# Patient Record
Sex: Male | Born: 1985 | Race: Black or African American | Hispanic: No | Marital: Single | Smoking: Current every day smoker
Health system: Southern US, Community
[De-identification: ages and names within clinical notes are randomized; demographics above are authoritative.]

---

## 2020-11-17 ENCOUNTER — Encounter (HOSPITAL_COMMUNITY): Payer: Self-pay | Admitting: Emergency Medicine

## 2020-11-17 ENCOUNTER — Emergency Department (HOSPITAL_COMMUNITY): Payer: Worker's Compensation

## 2020-11-17 ENCOUNTER — Emergency Department (HOSPITAL_COMMUNITY)
Admission: EM | Admit: 2020-11-17 | Discharge: 2020-11-17 | Disposition: A | Discharge status: Home / Self Care | Payer: Worker's Compensation | Attending: Emergency Medicine | Admitting: Emergency Medicine

## 2020-11-17 ENCOUNTER — Other Ambulatory Visit: Payer: Self-pay

## 2020-11-17 DIAGNOSIS — X501XXA Overexertion from prolonged static or awkward postures, initial encounter: Secondary | ICD-10-CM | POA: Diagnosis not present

## 2020-11-17 DIAGNOSIS — F17210 Nicotine dependence, cigarettes, uncomplicated: Secondary | ICD-10-CM | POA: Insufficient documentation

## 2020-11-17 DIAGNOSIS — S86001A Unspecified injury of right Achilles tendon, initial encounter: Secondary | ICD-10-CM | POA: Diagnosis not present

## 2020-11-17 DIAGNOSIS — S99911A Unspecified injury of right ankle, initial encounter: Secondary | ICD-10-CM | POA: Diagnosis present

## 2020-11-17 MED ORDER — HYDROCODONE-ACETAMINOPHEN 5-325 MG PO TABS: 1.0000 | ORAL_TABLET | Freq: Four times a day (QID) | ORAL | 0 refills | Status: DC | PRN

## 2020-11-17 MED ORDER — NAPROXEN 500 MG PO TABS: 500.0000 mg | ORAL_TABLET | Freq: Two times a day (BID) | ORAL | 0 refills | Status: DC

## 2020-11-17 MED ORDER — OXYCODONE-ACETAMINOPHEN 5-325 MG PO TABS
1.0000 | ORAL_TABLET | Freq: Once | ORAL | Status: AC
Start: 2020-11-17 — End: 2020-11-17
  Administered 2020-11-17: 17:00:00 1 via ORAL
  Filled 2020-11-17: qty 1

## 2020-11-17 NOTE — ED Provider Notes (Signed)
[facility] EMERGENCY DEPARTMENT Provider Note   CSN: [id number] Arrival date & time: 11/17/20  1507     History Chief Complaint  Patient presents with  . Ankle Pain    Craig Bowers is a 34 y.o. male.  Patient states that he was running and started having pain in his right ankle  The history is provided by the patient and medical records. No language interpreter was used.  Ankle Pain Location:  Ankle Injury: yes   Ankle location:  R ankle Pain details:    Quality:  Aching   Radiates to:  Does not radiate   Severity:  Moderate   Onset quality:  Sudden Associated symptoms: no back pain and no fatigue        No past medical history on file.  There are no problems to display for this patient.        No family history on file.  Social History   Tobacco Use  . Smoking status: Current Every Day Smoker    Packs/day: 0.50    Types: Cigarettes    Home Medications Prior to Admission medications   Not on File    Allergies    Patient has no allergy information on record.  Review of Systems   Review of Systems  Constitutional: Negative for appetite change and fatigue.  HENT: Negative for congestion, ear discharge and sinus pressure.   Eyes: Negative for discharge.  Respiratory: Negative for cough.   Cardiovascular: Negative for chest pain.  Gastrointestinal: Negative for abdominal pain and diarrhea.  Genitourinary: Negative for frequency and hematuria.  Musculoskeletal: Negative for back pain.       Right ankle pain  Skin: Negative for rash.  Neurological: Negative for seizures and headaches.  Psychiatric/Behavioral: Negative for hallucinations.    Physical Exam Updated Vital Signs BP (!) 154/104 (BP Location: Right Arm)   Pulse 94   Temp 98.5 F (36.9 C) (Oral)   Resp 18   Ht 5\' 11"  (1.803 m)   Wt 108.9 kg   SpO2 100%   BMI 33.47 kg/m   Physical Exam Vitals and nursing note reviewed.  Constitutional:      Appearance: He is  well-developed.  HENT:     Head: Normocephalic.     Nose: Nose normal.  Eyes:     Conjunctiva/sclera: Conjunctivae normal.  Neck:     Trachea: No tracheal deviation.  Cardiovascular:     Rate and Rhythm: Normal rate and regular rhythm.     Heart sounds: No murmur heard.   Pulmonary:     Effort: Pulmonary effort is normal.  Musculoskeletal:     Comments: Patient has tenderness over the right Achilles insertion.  It does not feel like a complete tear possibly partial tear.  Skin:    General: Skin is warm.  Neurological:     Mental Status: He is alert and oriented to person, place, and time.  Psychiatric:        Mood and Affect: Mood and affect normal.     ED Results / Procedures / Treatments   Labs (all labs ordered are listed, but only abnormal results are displayed) Labs Reviewed - No data to display  EKG None  Radiology DG Ankle Complete Right  Result Date: 11/17/2020 CLINICAL DATA:  Fall twisting right ankle. EXAM: RIGHT ANKLE - COMPLETE 3+ VIEW COMPARISON:  None. FINDINGS: There is no evidence of fracture, dislocation, or joint effusion. The ankle mortise is preserved. There is no evidence of arthropathy. Probable  bone island in the distal tibia. Enthesopathic changes at the Achilles tendon insertion. There additional well-marginated calcifications in the region of the mid Achilles tendon. No adjacent soft tissue edema. Mild generalized soft tissue edema. IMPRESSION: 1. Soft tissue edema without acute osseous abnormality. 2. 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 Electronically Signed   By: Narda Rutherford M.D.   On: 11/17/2020 17:10    Procedures Procedures (including critical care time)  Medications Ordered in ED Medications  oxyCODONE-acetaminophen (PERCOCET/ROXICET) 5-325 MG per tablet 1 tablet (1 tablet  Oral Given 11/17/20 1640)    ED Course  I have reviewed the triage vital signs and the nursing notes.  Pertinent labs & imaging results that were available during my care of the patient were reviewed by me and considered in my medical decision making (see chart for details).    MDM Rules/Calculators/A&P                         X-ray unremarkable.  Patient with Achilles injury to right ankle.  He is given crutches nonsteroidals and pain medicine and will follow up with Ortho Final Clinical Impression(s) / ED Diagnoses Final diagnoses:  None    Rx / DC Orders ED Discharge Orders    None       Bethann Berkshire, MD 11/17/20 1731
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
## 2020-11-17 NOTE — Discharge Instructions (Signed)
Do not put any weight on your foot.  Use crutches to ambulate.  Follow-up with Dr. Charlesetta Ivory either this week or next week

## 2020-11-19 ENCOUNTER — Telehealth: Payer: Self-pay | Admitting: Orthopedic Surgery
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
## 2020-11-21 ENCOUNTER — Ambulatory Visit (INDEPENDENT_AMBULATORY_CARE_PROVIDER_SITE_OTHER): Payer: Self-pay | Admitting: Orthopedic Surgery

## 2020-11-21 ENCOUNTER — Encounter: Payer: Self-pay | Admitting: Orthopedic Surgery

## 2020-11-21 ENCOUNTER — Other Ambulatory Visit: Payer: Self-pay

## 2020-11-21 VITALS — BP 145/100 | HR 92 | Ht 71.0 in | Wt 238.0 lb

## 2020-11-21 DIAGNOSIS — S86001A Unspecified injury of right Achilles tendon, initial encounter: Secondary | ICD-10-CM

## 2020-11-21 NOTE — Patient Instructions (Addendum)
Please provide note for work.  Out of work until he is seen in clinic again.     Smoking Tobacco Information, Adult Smoking tobacco can be harmful to your health. Tobacco contains a poisonous (toxic), colorless chemical called nicotine. Nicotine is addictive. It changes the brain and can make it hard to stop smoking. Tobacco also has other toxic chemicals that can hurt your body and raise your risk of many cancers. How can smoking tobacco affect me? Smoking tobacco puts you at risk for:  Cancer. Smoking is most commonly associated with lung cancer, but can also lead to cancer in other parts of the body.  Chronic obstructive pulmonary disease (COPD). This is a long-term lung condition that makes it hard to breathe. It also gets worse over time.  High blood pressure (hypertension), heart disease, stroke, or heart attack.  Lung infections, such as pneumonia.  Cataracts. This is when the lenses in the eyes become clouded.  Digestive problems. This may include peptic ulcers, heartburn, and gastroesophageal reflux disease (GERD).  Oral health problems, such as gum disease and tooth loss.  Loss of taste and smell. Smoking can affect your appearance by causing:  Wrinkles.  Yellow or stained teeth, fingers, and fingernails. Smoking tobacco can also affect your social life, because:  It may be challenging to find places to smoke when away from home. Many workplaces, [employer], hotels, and public places are tobacco-free.  Smoking is expensive. This is due to the cost of tobacco and the long-term costs of treating health problems from smoking.  Secondhand smoke may affect those around you. Secondhand smoke can cause lung cancer, breathing problems, and heart disease. Children of smokers have a higher risk for: ? Sudden infant death syndrome (SIDS). ? Ear infections. ? Lung infections. If you currently smoke tobacco, quitting now can help you:  Lead a longer and healthier life.  Look,  smell, breathe, and feel better over time.  Save money.  Protect others from the harms of secondhand smoke. What actions can I take to prevent health problems? Quit smoking   Do not start smoking. Quit if you already do.  Make a plan to quit smoking and commit to it. Look for programs to help you and ask your health care provider for recommendations and ideas.  Set a date and write down all the reasons you want to quit.  Let your friends and family know you are quitting so they can help and support you. Consider finding friends who also want to quit. It can be easier to quit with someone else, so that you can support each other.  Talk with your health care provider about using nicotine replacement medicines to help you quit, such as gum, lozenges, patches, sprays, or pills.  Do not replace cigarette smoking with electronic cigarettes, which are commonly called e-cigarettes. The safety of e-cigarettes is not known, and some may contain harmful chemicals.  If you try to quit but return to smoking, stay positive. It is common to slip up when you first quit, so take it one day at a time.  Be prepared for cravings. When you feel the urge to smoke, chew gum or suck on hard candy. Lifestyle  Stay busy and take care of your body.  Drink enough fluid to keep your urine pale yellow.  Get plenty of exercise and eat a healthy diet. This can help prevent weight gain after quitting.  Monitor your eating habits. Quitting smoking can cause you to have a larger appetite than  when you smoke.  Find ways to relax. Go out with friends or family to a movie or a restaurant where people do not smoke.  Ask your health care provider about having regular tests (screenings) to check for cancer. This may include blood tests, imaging tests, and other tests.  Find ways to manage your stress, such as meditation, yoga, or exercise. Where to find support To get support to quit smoking, consider:  Asking your  health care provider for more information and resources.  Taking classes to learn more about quitting smoking.  Looking for local organizations that offer resources about quitting smoking.  Joining a support group for people who want to quit smoking in your local community.  Calling the smokefree.gov counselor helpline: 1-800-Quit-Now 610-485-2463) Where to find more information You may find more information about quitting smoking from:  HelpGuide.org: www.helpguide.org  BankRights[employer]: smokefree.gov  American Lung Association: www.lung.org Contact a health care provider if you:  Have problems breathing.  Notice that your lips, nose, or fingers turn blue.  Have chest pain.  Are coughing up blood.  Feel faint or you pass out.  Have other health changes that cause you to worry. Summary  Smoking tobacco can negatively affect your health, the health of those around you, your finances, and your social life.  Do not start smoking. Quit if you already do. If you need help quitting, ask your health care provider.  Think about joining a support group for people who want to quit smoking in your local community. There are many effective programs that will help you to quit this behavior. This information is not intended to replace advice given to you by your health care provider. Make sure you discuss any questions you have with your health care provider. Document Revised: 08/17/2019 Document Reviewed: 12/07/2016 Elsevier Patient Education  2020 [employer].
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
## 2020-12-03 ENCOUNTER — Ambulatory Visit (HOSPITAL_COMMUNITY)
Admission: RE | Admit: 2020-12-03 | Discharge: 2020-12-03 | Disposition: A | Discharge status: Home / Self Care | Payer: Worker's Compensation | Source: Ambulatory Visit | Attending: Orthopedic Surgery | Admitting: Orthopedic Surgery

## 2020-12-03 ENCOUNTER — Encounter: Payer: Self-pay | Admitting: Orthopedic Surgery

## 2020-12-03 ENCOUNTER — Ambulatory Visit (INDEPENDENT_AMBULATORY_CARE_PROVIDER_SITE_OTHER): Payer: Self-pay | Admitting: Orthopedic Surgery

## 2020-12-03 ENCOUNTER — Other Ambulatory Visit: Payer: Self-pay

## 2020-12-03 VITALS — BP 151/103 | HR 84 | Ht 71.0 in | Wt 238.0 lb

## 2020-12-03 DIAGNOSIS — M79661 Pain in right lower leg: Secondary | ICD-10-CM | POA: Diagnosis not present

## 2020-12-03 DIAGNOSIS — S86001D Unspecified injury of right Achilles tendon, subsequent encounter: Secondary | ICD-10-CM

## 2020-12-03 MED ORDER — NAPROXEN 500 MG PO TABS: 500.0000 mg | ORAL_TABLET | Freq: Two times a day (BID) | ORAL | 0 refills | Status: DC

## 2020-12-03 MED ORDER — HYDROCODONE-ACETAMINOPHEN 5-325 MG PO TABS: 1.0000 | ORAL_TABLET | Freq: Four times a day (QID) | ORAL | 0 refills | Status: AC | PRN

## 2020-12-03 NOTE — Patient Instructions (Signed)
Central Scheduling [phone]  

## 2020-12-03 NOTE — Progress Notes (Signed)
Orthopaedic Clinic Return  Assessment: Craig Bowers is a 34 y.o. male with the following: Partial right Achilles tendon injury; myotendinous junction injury  Plan: Craig Bowers continues to have pain in his right lower leg.  In fact, it has worsened over the past week or so.  He has less pain in his Achilles, but is complaining of exquisite tenderness in his gastroc.  This are is firm, and a little warm to touch.  I am concerned he may have a DVT in his right leg.  We have scheduled a STAT ultrasound and I will contact him once the results are available.  He has also requested some pain medications, but I have stressed to him that we need to limit the use of narcotics.  I will see him back in 2 weeks for repeat evaluation, pending the outcome of the ultrasound.   Meds ordered this encounter  Medications  . naproxen (NAPROSYN) 500 MG tablet    Sig: Take 1 tablet (500 mg total) by mouth 2 (two) times daily with a meal.    Dispense:  60 tablet    Refill:  0  . HYDROcodone-acetaminophen (NORCO/VICODIN) 5-325 MG tablet    Sig: Take 1 tablet by mouth every 6 (six) hours as needed for up to 7 days for moderate pain.    Dispense:  15 tablet    Refill:  0      Follow-up: Return in about 2 weeks (around 12/17/2020).   Subjective:  Chief Complaint  Patient presents with  . Leg Pain    Rt achilles pain  . Medication Refill    Hydrocodone and naprosyn    History of Present Illness: Craig Bowers is a 34 y.o. male who returns to clinic for repeat evaluation of his right Achilles injury.  He was last seen in clinic approximately 2 weeks ago.  He was placed in a walking boot with a lift, and advised to use crutches as needed.  He was doing well, until approximately 1 week ago, when he started to develop worsening pain in his right gastrocnemius muscle.  It is not exquisitely tender to palpation.  It bothers him if he rests his leg on the edge of the chair.  He has run out of narcotic pain  medications, but has been taking ibuprofen as needed.  This helps with some of his pain, but does not take it away.  He has less pain in the distal aspect of his calf and his Achilles tendon.  Review of Systems: No fevers or chills No numbness or tingling No chest pain No shortness of breath No bowel or bladder dysfunction No GI distress No headaches  Objective: BP (!) 151/103   Pulse 84   Ht 5\' 11"  (1.803 m)   Wt 238 lb (108 kg)   BMI 33.19 kg/m   Physical Exam:  Evaluation of the right lower extremity demonstrates extreme tenderness within the gastrocnemius muscle.  This area is mildly swollen.  I can also appreciate some warmth.  Distally, at the myotendinous junction, he continues to have some tenderness, but this is improved.  No tenderness palpation at the insertion of the Achilles.  He is able to plantarflex his foot with some power.  He continues to ambulate with the assistance of a crutch in a walking boot.  IMAGING: I personally ordered and reviewed the following images:  No new imaging obtained.   , MD 12/03/2020 10:49 AM

## 2020-12-17 ENCOUNTER — Other Ambulatory Visit: Payer: Self-pay

## 2020-12-17 ENCOUNTER — Encounter: Payer: Self-pay | Admitting: Orthopedic Surgery

## 2020-12-17 ENCOUNTER — Ambulatory Visit (INDEPENDENT_AMBULATORY_CARE_PROVIDER_SITE_OTHER): Payer: Self-pay | Admitting: Orthopedic Surgery

## 2020-12-17 VITALS — BP 145/107 | HR 85 | Ht 71.0 in

## 2020-12-17 DIAGNOSIS — S86001D Unspecified injury of right Achilles tendon, subsequent encounter: Secondary | ICD-10-CM

## 2020-12-17 NOTE — Patient Instructions (Signed)
Note excusing him from work  Referral to PT for R Achilles tendon, partial rupture

## 2020-12-17 NOTE — Progress Notes (Signed)
Orthopaedic Clinic Return  Assessment: Craig Bowers is a 35 y.o. male with the following: Partial right Achilles tendon injury; myotendinous junction injury  Plan: Mr. Szeto' pain has significantly improved.  He no longer has the tenderness within the gastrocnemius muscle.  His U/S was negative for a clot.  He sustained a partial injury to his Achilles 4 weeks ago.  Ok to transition out of the walking boot and initiate PT.  Referral placed today.  Follow up 4 weeks.   Follow-up: Return in about 4 weeks (around 01/14/2021).   Subjective:  Chief Complaint  Patient presents with  . Foot Pain    2 week follow up, patient reports he is doing better,    History of Present Illness: Craig Bowers is a 35 y.o. male who returns to clinic for repeat evaluation of his right Achilles injury.  He was last seen in clinic approximately 2 weeks ago.  At that time, he had tenderness in his calf muscle.  He was sent for an U/S to evaluate for a clot, which was negative.  Since then, his pain has improved.  He is ambulating better, but still notes some pain and lack of power when walking.  He is not using crutches and is able to bear more weight.   Review of Systems: No fevers or chills No numbness or tingling No chest pain No shortness of breath No bowel or bladder dysfunction No GI distress No headaches  Objective: BP (!) 145/107   Pulse 85   Ht 5\' 11"  (1.803 m)   BMI 33.19 kg/m   Physical Exam:  Evaluation of the right lower extremity demonstrates minimal tenderness within the calf muscles.  Mild tenderness around the myotendinous junction.  Achilles remains in continuity.  Able to plantar flex with some discomfort.  Toes WWP.    IMAGING: I personally ordered and reviewed the following images:  No new imaging obtained.   , MD 12/17/2020 5:37 PM

## 2020-12-23 ENCOUNTER — Telehealth: Payer: Self-pay | Admitting: Radiology

## 2020-12-23 NOTE — Telephone Encounter (Signed)
Thank you for the message  He has had this issues previously, following his injury.  It is possible he is developing a blood clot, but we were able to rule this out previously.  At the last visit, he stated he has been taking an aspiring still, which will help to limit his risk.  If he is concerned enough, we schedule him another ultrasound to evaluate for a DVT.    If it is not a DVT, it could be related to a change in his activity, or discontinuation of his walking boot.   Please let me know how he wishes to proceed.    Niranjan Rufener A. Dallas Schimke, MD [facility] [address] [place],  [place]  [address] Phone: [phone] Fax: [phone]

## 2020-12-23 NOTE — Telephone Encounter (Signed)
Please advise 

## 2020-12-23 NOTE — Telephone Encounter (Signed)
Patient called, says he has pain between calf and posterior ankle.  Started earlier today 5am.  Woke up with it that way.  Says mid calf is swelling. Pt has the boot at home and has been wearing it. Please call to discuss.

## 2020-12-24 ENCOUNTER — Other Ambulatory Visit: Payer: Self-pay

## 2020-12-24 ENCOUNTER — Encounter (HOSPITAL_COMMUNITY): Payer: Self-pay | Admitting: Physical Therapy

## 2020-12-24 ENCOUNTER — Ambulatory Visit (HOSPITAL_COMMUNITY): Payer: Worker's Compensation | Attending: Orthopedic Surgery | Admitting: Physical Therapy

## 2020-12-24 DIAGNOSIS — M6281 Muscle weakness (generalized): Secondary | ICD-10-CM | POA: Insufficient documentation

## 2020-12-24 DIAGNOSIS — M25571 Pain in right ankle and joints of right foot: Secondary | ICD-10-CM | POA: Diagnosis present

## 2020-12-24 DIAGNOSIS — R262 Difficulty in walking, not elsewhere classified: Secondary | ICD-10-CM | POA: Diagnosis present
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
## 2020-12-29 NOTE — Telephone Encounter (Signed)
I called and spoke with the patient and he does not want to proceed with a test at this time. He wants to come in sooner.

## 2020-12-29 NOTE — Telephone Encounter (Signed)
Patient has a sooner appointment.

## 2020-12-30 ENCOUNTER — Other Ambulatory Visit: Payer: Self-pay

## 2020-12-30 ENCOUNTER — Ambulatory Visit (HOSPITAL_COMMUNITY): Payer: Worker's Compensation | Admitting: Physical Therapy

## 2020-12-30 ENCOUNTER — Encounter (HOSPITAL_COMMUNITY): Payer: Self-pay | Admitting: Physical Therapy

## 2020-12-30 DIAGNOSIS — M6281 Muscle weakness (generalized): Secondary | ICD-10-CM

## 2020-12-30 DIAGNOSIS — M25571 Pain in right ankle and joints of right foot: Secondary | ICD-10-CM

## 2020-12-30 DIAGNOSIS — R262 Difficulty in walking, not elsewhere classified: Secondary | ICD-10-CM

## 2020-12-30 NOTE — Therapy (Signed)
[facility] [address] [place], [place], [address] Phone: [phone]   Fax:  [phone]  Physical Therapy Treatment  Patient Details  Name: Craig Bowers MRN: [id number] Date of Birth: 02/02/1986 Referring Provider (PT): MD Thane Edu   Encounter Date: 12/30/2020   PT End of Session - 12/30/20 1041    Visit Number 2    Number of Visits 12    Date for PT Re-Evaluation 02/18/21    Authorization Type self pay    Progress Note Due on Visit 10    PT Start Time 1045    PT Stop Time 1125    PT Time Calculation (min) 40 min    Activity Tolerance Patient limited by pain    Behavior During Therapy [facility] for tasks assessed/performed           History reviewed. No pertinent past medical history.  History reviewed. No pertinent surgical history.  There were no vitals filed for this visit.   Subjective Assessment - 12/30/20 1054    Subjective Current pain level is 3/10 in the back of the leg and is constant. Pain described as achy and dull. Has been trying to walk and wean from the boot but it has been difficult. Exercises are going alright. States that he hasn't looked into the compression garments yet. States he contacted his MD about the swelling and is seeing him on Friday, he started a baby aspirin prior to that appointment per MD recommendation.    Currently in Pain? Yes    Pain Score 3     Pain Location Ankle    Pain Orientation Right;Posterior    Pain Descriptors / Indicators Aching    Pain Onset More than a month ago              [facility] PT Assessment - 12/30/20 0001      Assessment   Medical Diagnosis right achille's tear    Referring Provider (PT) MD Thane Edu                         [facility] Adult PT Treatment/Exercise - 12/30/20 0001      Ambulation/Gait   Ambulation/Gait Yes    Ambulation Distance (Feet) 226 Feet    Assistive device Straight cane    Gait Pattern Decreased step length -  left;Decreased stride length;Decreased dorsiflexion - right;Decreased stance time - right;Decreased weight shift to right;Right foot flat;Antalgic;Lateral trunk lean to left    Ambulation Surface Level;Indoor    Gait velocity reduced    Gait Comments with regular tennis shoes, cues for toe off and increased weight on R LE     Ankle Exercises: Seated   Other Seated Ankle Exercises tibial translation (knee flexion with ball for DF) 2x10 5" holds; DF bilateral 2x10    Other Seated Ankle Exercises ankle ROM on 1/2 foam 2x2 minutes AAROM R DF/PF then Inv/Eversion      Ankle Exercises: Standing   Other Standing Ankle Exercises weight shifts to right with counter support as needed (progressed to no support) x6 10" holdsR    Other Standing Ankle Exercises PF weight shifts - increased pain to 5/10 2x5 R (weight on Left leg) -- stopped due to pain                  PT Education - 12/30/20 1055    Education Details Educated patient in signs and symptoms as well as well as risks  associated with developing a DVT    Person(s) Educated Patient    Methods Explanation    Comprehension Verbalized understanding            PT Short Term Goals - 12/24/20 1009      PT SHORT TERM GOAL #1   Title Patient will be able to ambulate at least 226 feet in 2 minutes in regular tennis shoes to demonstrate improved walking endurance out of boot.    Time 4    Period Weeks    Status New    Target Date 01/21/21      PT SHORT TERM GOAL #2   Title Patient will report at least 50% improvement in overall symptoms and/or function to demonstrate improved functional mobility    Time 4    Period Weeks    Status New    Target Date 01/21/21      PT SHORT TERM GOAL #3   Title Patient will be independent in self management strategies to improve quality of life and functional outcomes.    Time 4    Period Weeks    Status New    Target Date 01/21/21             PT Long Term Goals - 12/24/20 1010      PT  LONG TERM GOAL #1   Title Patient will be able to ascend and descend stairs with use of railing as needed and in regular shoes with step through gait pattern to improve ability to go up and down stairs at work.    Time 8    Period Weeks    Status New    Target Date 02/18/21      PT LONG TERM GOAL #2   Title Patient will report at least 75% improvement in overall symptoms and/or function to demonstrate improved functional mobility    Time 8    Period Weeks    Target Date 02/18/21      PT LONG TERM GOAL #3   Title Patient will improve on FOTO score to meet predicted outcomes to demonstrate improved functional mobility.    Time 8    Period Weeks    Status New    Target Date 02/18/21                 Plan - 12/30/20 1041    Clinical Impression Statement Able to walk well with cane today and demonstrating reduced limp. Pain continues to present with active assisted movements. Trialed standing weight shift with weight on left lower extremity to get AAROM R PF with toe off but this was too challenging for patient. Able to tolerate seated heel raises better. Educated patient on using walking stick as needed to assist with weaning from the boot. Homan's sign negative on this date.    Personal Factors and Comorbidities Transportation    Examination-Activity Limitations Squat;Stairs;Stand;Transfers;Locomotion Level;Lift;Caring for Others    Examination-Participation Restrictions Cleaning;Community Activity;Driving;Meal Prep;Occupation;Yard Work;Shop    Stability/Clinical Decision Making Stable/Uncomplicated    Rehab Potential Good    PT Frequency Other (comment)   1-2x/week for total of 12 vistis over 8 week certification   PT Duration 8 weeks    PT Treatment/Interventions ADLs/Self Care Home Management;Aquatic Therapy;Electrical Stimulation;Cryotherapy;Moist Heat;Balance training;Therapeutic exercise;Therapeutic activities;Functional mobility training;Stair training;Gait  training;Neuromuscular re-education;Patient/family education;Manual techniques;Dry needling;Passive range of motion;Joint Manipulations    PT Next Visit Plan Perform FOTO (not set up at time for initital evaluation), weight shifts, ankle ROM, wean from boot, AROM/PROM/Isometrics  PT Home Exercise Plan ankle pumps, ankle circles, compression sock, self massage to lower leg; seated heel raises, walking wiht stick/cane and seated tibial translation exercise    Consulted and Agree with Plan of Care Patient           Patient will benefit from skilled therapeutic intervention in order to improve the following deficits and impairments:  Abnormal gait,Decreased endurance,Increased edema,Decreased activity tolerance,Decreased strength,Pain,Decreased balance,Decreased mobility,Difficulty walking,Decreased range of motion  Visit Diagnosis: Difficulty in walking, not elsewhere classified  Muscle weakness (generalized)  Pain in right ankle and joints of right foot     Problem List There are no problems to display for this patient.   11:31 AM, 12/30/20 Tereasa Coop, DPT Physical Therapy with [facility]  [phone] office   [facility] [address] [place], [place], [address] Phone: [phone]   Fax:  [phone]  Name: Craig Bowers MRN: [id number] Date of Birth: Apr 02, 1986

## 2021-01-02 ENCOUNTER — Encounter: Payer: Self-pay | Admitting: Orthopedic Surgery

## 2021-01-02 ENCOUNTER — Other Ambulatory Visit: Payer: Self-pay

## 2021-01-02 ENCOUNTER — Ambulatory Visit (INDEPENDENT_AMBULATORY_CARE_PROVIDER_SITE_OTHER): Payer: Self-pay | Admitting: Orthopedic Surgery

## 2021-01-02 VITALS — BP 144/108 | HR 84 | Ht 71.0 in | Wt 238.0 lb

## 2021-01-02 DIAGNOSIS — S86001D Unspecified injury of right Achilles tendon, subsequent encounter: Secondary | ICD-10-CM

## 2021-01-02 NOTE — Patient Instructions (Signed)

## 2021-01-02 NOTE — Progress Notes (Signed)
Orthopaedic Clinic Return  Assessment: Craig Bowers is a 35 y.o. male with the following: Partial right Achilles tendon injury; myotendinous junction injury  Plan: Craig Bowers has had some worsening and new pain since starting PT.  Based on his exam and description of the problems, this pain is to be expected.  Continue to work with PT and he can expect some minor aches and pains as he transitions to a regular shoe.  Continue with the aspirin.  Work to get into a Engineer, building services show. Follow up 4 weeks.  If he feels better, and he thinks he can return to work, ok to return sooner.   Follow-up: Return in about 4 weeks (around 01/30/2021).   Subjective:  Chief Complaint  Patient presents with  . Leg Pain    R/calf is hurting some, but therapist said it looked swollen and to have it looked at    History of Present Illness: Craig Bowers is a 35 y.o. male who returns to clinic for repeat evaluation of his right Achilles injury.  He has a partial Achilles injury, and probable muscle injury as well.  He has struggled as he transitions to a regular shoe and increases his activity.  He has noticed some swelling in his leg and ankle, but this improves with rest.  The proximal calf tenderness has improved, but he still has some distal calf and Achilles pain.  Continues to take aspirin.   Review of Systems: No fevers or chills No numbness or tingling No chest pain No shortness of breath No bowel or bladder dysfunction No GI distress No headaches  Objective: BP (!) 144/108   Pulse 84   Ht 5\' 11"  (1.803 m)   Wt 238 lb (108 kg)   BMI 33.19 kg/m   Physical Exam:  Achilles tendon palpable.  TTP closer to the myotendinous junction.  Mild diffuse swelling around the ankle and the foot.  Can get to a plantigrade position. Toes are WWP.  Proximal calf without swelling, very little tenderness.    IMAGING: I personally ordered and reviewed the following images:  No new imaging obtained.   , MD 01/02/2021 9:25 AM

## 2021-01-05 ENCOUNTER — Telehealth (HOSPITAL_COMMUNITY): Payer: Self-pay

## 2021-01-05 ENCOUNTER — Ambulatory Visit (HOSPITAL_COMMUNITY): Payer: Worker's Compensation

## 2021-01-05 NOTE — Telephone Encounter (Signed)
pt called to cx today's visit due to he has to take his daughter to the doctor's appt.

## 2021-01-08 ENCOUNTER — Other Ambulatory Visit: Payer: Self-pay

## 2021-01-08 ENCOUNTER — Encounter (HOSPITAL_COMMUNITY): Payer: Self-pay

## 2021-01-08 ENCOUNTER — Ambulatory Visit (HOSPITAL_COMMUNITY): Payer: Worker's Compensation | Attending: Orthopedic Surgery

## 2021-01-08 DIAGNOSIS — R262 Difficulty in walking, not elsewhere classified: Secondary | ICD-10-CM | POA: Insufficient documentation

## 2021-01-08 DIAGNOSIS — M6281 Muscle weakness (generalized): Secondary | ICD-10-CM | POA: Insufficient documentation

## 2021-01-08 DIAGNOSIS — M25571 Pain in right ankle and joints of right foot: Secondary | ICD-10-CM | POA: Diagnosis present

## 2021-01-08 NOTE — Therapy (Signed)
[facility] [address] [place], [place], [address] Phone: [phone]   Fax:  [phone]  Physical Therapy Treatment  Patient Details  Name: Craig Bowers MRN: [id number] Date of Birth: 1986/01/21 Referring Provider (PT): MD Thane Edu   Encounter Date: 01/08/2021   PT End of Session - 01/08/21 0840    Visit Number 3    Number of Visits 12    Date for PT Re-Evaluation 02/18/21    Authorization Type self pay    Progress Note Due on Visit 10    PT Start Time 0842    PT Stop Time 0925    PT Time Calculation (min) 43 min    Activity Tolerance Patient limited by pain    Behavior During Therapy [facility] for tasks assessed/performed           History reviewed. No pertinent past medical history.  History reviewed. No pertinent surgical history.  There were no vitals filed for this visit.   Subjective Assessment - 01/08/21 0840    Subjective 2-3/10 in lower calf area; not wearing CAM boot most of the time unless going grocery shopping or knows he will be walking longer distances. Performing HEP. Appointment with Dr Dallas Schimke - will be out of work for another month per MD.    Pain Onset More than a month ago              [facility] PT Assessment - 01/08/21 0001      Observation/Other Assessments   Focus on Therapeutic Outcomes (FOTO)  36 (64% limited)   performed 12/30/20          [facility] Adult PT Treatment/Exercise - 01/08/21 0001      Ambulation/Gait   Ambulation/Gait Yes    Ambulation Distance (Feet) 150 Feet    Assistive device None    Gait Pattern Decreased step length - left;Decreased stride length;Decreased dorsiflexion - right;Decreased stance time - right;Decreased weight shift to right;Right foot flat;Antalgic    Ambulation Surface Level;Indoor    Gait velocity reduced    Gait Comments with regular tennis shoes      Ankle Exercises: Seated   ABC's 1 rep    Heel Raises 10 reps;2 seconds    Toe Raise 10 reps;2 seconds     Heel Slides Right   2 minutes DF/PF   Other Seated Ankle Exercises tibial translation (knee flexion with ball for DF) 2x10 5" holds; DF bilateral 2x10    Other Seated Ankle Exercises ankle ROM on 1/2 foam 2x2 minutes AAROM R DF/PF then Inv/Eversion            PT Education - 01/08/21 0916    Education Details Discussed purpose and technique of interventions throughout session. Advance HEP.    Person(s) Educated Patient    Methods Explanation;Handout    Comprehension Verbalized understanding            PT Short Term Goals - 01/08/21 0841      PT SHORT TERM GOAL #1   Title Patient will be able to ambulate at least 226 feet in 2 minutes in regular tennis shoes to demonstrate improved walking endurance out of boot.    Time 4    Period Weeks    Status On-going    Target Date 01/21/21      PT SHORT TERM GOAL #2   Title Patient will report at least 50% improvement in overall symptoms and/or function to demonstrate improved functional mobility    Time  4    Period Weeks    Status On-going    Target Date 01/21/21      PT SHORT TERM GOAL #3   Title Patient will be independent in self management strategies to improve quality of life and functional outcomes.    Time 4    Period Weeks    Status On-going    Target Date 01/21/21             PT Long Term Goals - 01/08/21 0841      PT LONG TERM GOAL #1   Title Patient will be able to ascend and descend stairs with use of railing as needed and in regular shoes with step through gait pattern to improve ability to go up and down stairs at work.    Time 8    Period Weeks    Status On-going      PT LONG TERM GOAL #2   Title Patient will report at least 75% improvement in overall symptoms and/or function to demonstrate improved functional mobility    Time 8    Period Weeks    Status On-going      PT LONG TERM GOAL #3   Title Patient will improve on FOTO score to meet predicted outcomes to demonstrate improved functional  mobility.    Time 8    Period Weeks    Status On-going             Plan - 01/08/21 0840    Clinical Impression Statement Session focused on gait training, right ankle AROM and low level strengthening in the seated position. Advanced HEP to include ankle alphabet and towel slides for DF/PF and inversion/eversion. Overall patient tolerated treatment well with no increased pain complaints at end of session, remaining at 3/10, with increased motion during gait exiting clinic. Patient would continue to benefit from skilled physical therapy to improve overall mobility and return him to optimal function.    Personal Factors and Comorbidities Transportation    Examination-Activity Limitations Squat;Stairs;Stand;Transfers;Locomotion Level;Lift;Caring for Others    Examination-Participation Restrictions Cleaning;Community Activity;Driving;Meal Prep;Occupation;Yard Work;Shop    Stability/Clinical Decision Making Stable/Uncomplicated    Rehab Potential Good    PT Frequency Other (comment)   1-2x/week for total of 12 vistis over 8 week certification   PT Duration 8 weeks    PT Treatment/Interventions ADLs/Self Care Home Management;Aquatic Therapy;Electrical Stimulation;Cryotherapy;Moist Heat;Balance training;Therapeutic exercise;Therapeutic activities;Functional mobility training;Stair training;Gait training;Neuromuscular re-education;Patient/family education;Manual techniques;Dry needling;Passive range of motion;Joint Manipulations    PT Next Visit Plan weight shifts, ankle ROM, wean from boot, AROM/PROM/Isometrics    PT Home Exercise Plan ankle pumps, ankle circles, compression sock, self massage to lower leg; seated heel raises, walking wiht stick/cane and seated tibial translation exercise; 01/08/21 - ankle alphabet, towel slides DF/PF/IN/EV    Consulted and Agree with Plan of Care Patient           Patient will benefit from skilled therapeutic intervention in order to improve the following deficits  and impairments:  Abnormal gait,Decreased endurance,Increased edema,Decreased activity tolerance,Decreased strength,Pain,Decreased balance,Decreased mobility,Difficulty walking,Decreased range of motion  Visit Diagnosis: Difficulty in walking, not elsewhere classified  Muscle weakness (generalized)  Pain in right ankle and joints of right foot     Problem List There are no problems to display for this patient.   Katina Dung. Hartnett-Rands, MS, PT Per [facility] #[id number] 01/08/2021, 9:30 AM  [facility] [address] [place], [place], [address] Phone: [phone]  Fax:  [phone]  Name: ROMEY MATHIESON MRN: [id number] Date of Birth: 07/09/1986

## 2021-01-08 NOTE — Patient Instructions (Signed)
Alphabet    Place pillow under calf so foot does not rub. Moving foot and ankle only, trace the letters of the alphabet from A to Z. Repeat _1 round through alphabet. Do _1__ sessions per day.  Copyright  VHI. All rights reserved.   TOWEL SLIDE FORWARD AND BACKWARD For 2 minutes keeping toes pointed forward the whole time.  Towel Slide    Sitting, use strong sideward motion with one foot to slide a towel. Smooth towel back out. Repeat with other foot. Repeat 10__ times. Do _1_ sessions per day.  http://gt2.exer.us/413   Copyright  VHI. All rights reserved.

## 2021-01-13 ENCOUNTER — Ambulatory Visit (HOSPITAL_COMMUNITY): Payer: Worker's Compensation | Admitting: Physical Therapy

## 2021-01-13 ENCOUNTER — Other Ambulatory Visit: Payer: Self-pay

## 2021-01-13 ENCOUNTER — Encounter (HOSPITAL_COMMUNITY): Payer: Self-pay | Admitting: Physical Therapy

## 2021-01-13 DIAGNOSIS — R262 Difficulty in walking, not elsewhere classified: Secondary | ICD-10-CM | POA: Diagnosis not present

## 2021-01-13 DIAGNOSIS — M6281 Muscle weakness (generalized): Secondary | ICD-10-CM

## 2021-01-13 DIAGNOSIS — M25571 Pain in right ankle and joints of right foot: Secondary | ICD-10-CM

## 2021-01-13 NOTE — Therapy (Signed)
[facility] [address] [place], [place], [address] Phone: [phone]   Fax:  [phone]  Physical Therapy Treatment  Patient Details  Name: Craig Bowers MRN: [id number] Date of Birth: 1986-08-02 Referring Provider (PT): MD Thane Edu   Encounter Date: 01/13/2021   PT End of Session - 01/13/21 1010    Visit Number 4    Number of Visits 12    Date for PT Re-Evaluation 02/18/21    Authorization Type self pay    Progress Note Due on Visit 10    PT Start Time 1002    PT Stop Time 1040    PT Time Calculation (min) 38 min    Activity Tolerance Patient limited by pain    Behavior During Therapy [facility] for tasks assessed/performed           History reviewed. No pertinent past medical history.  History reviewed. No pertinent surgical history.  There were no vitals filed for this visit.   Subjective Assessment - 01/13/21 1007    Subjective States that he is having about 2/10 along the back of the Achille's tendon. States that when he has pain it is difficult to do his exercises States some times he can go a couple days and be fine and then other times it can really start hurting.    Currently in Pain? Yes    Pain Score 2     Pain Location Ankle    Pain Orientation Right;Posterior    Pain Descriptors / Indicators Aching    Pain Onset More than a month ago              [facility] PT Assessment - 01/13/21 0001      Assessment   Medical Diagnosis right achille's tear    Referring Provider (PT) MD Thane Edu                         [facility] Adult PT Treatment/Exercise - 01/13/21 0001      Ankle Exercises: Standing   Other Standing Ankle Exercises weight shifts to right with counter support as needed 2x5 10" holdsR lateral then forward and backwards 3x5 3" holds with upper extremity support    Other Standing Ankle Exercises tandem walking 2x4 15 feet      Ankle Exercises: Seated   Other Seated Ankle Exercises  percussion gun to dyno disc for pain gating x2 3 minutes with flat head    Other Seated Ankle Exercises ankle DF/PF on 1/2 foam roller - x10 5" holds each                  PT Education - 01/13/21 1034    Education Details on anticipated pain, on weight bearing progressions, on use of vibration for pain gating.    Person(s) Educated Patient    Methods Explanation    Comprehension Verbalized understanding            PT Short Term Goals - 01/08/21 0841      PT SHORT TERM GOAL #1   Title Patient will be able to ambulate at least 226 feet in 2 minutes in regular tennis shoes to demonstrate improved walking endurance out of boot.    Time 4    Period Weeks    Status On-going    Target Date 01/21/21      PT SHORT TERM GOAL #2   Title Patient will report at least 50% improvement in overall  symptoms and/or function to demonstrate improved functional mobility    Time 4    Period Weeks    Status On-going    Target Date 01/21/21      PT SHORT TERM GOAL #3   Title Patient will be independent in self management strategies to improve quality of life and functional outcomes.    Time 4    Period Weeks    Status On-going    Target Date 01/21/21             PT Long Term Goals - 01/08/21 0841      PT LONG TERM GOAL #1   Title Patient will be able to ascend and descend stairs with use of railing as needed and in regular shoes with step through gait pattern to improve ability to go up and down stairs at work.    Time 8    Period Weeks    Status On-going      PT LONG TERM GOAL #2   Title Patient will report at least 75% improvement in overall symptoms and/or function to demonstrate improved functional mobility    Time 8    Period Weeks    Status On-going      PT LONG TERM GOAL #3   Title Patient will improve on FOTO score to meet predicted outcomes to demonstrate improved functional mobility.    Time 8    Period Weeks    Status On-going                 Plan -  01/13/21 1010    Clinical Impression Statement States that he is having about 2/10 along the back of the Achille's tendon. States that when he has pain it is difficult to do his exercises States some times he can go a couple days and be fine and then other times it can really start hurting.    Personal Factors and Comorbidities Transportation    Examination-Activity Limitations Squat;Stairs;Stand;Transfers;Locomotion Level;Lift;Caring for Others    Examination-Participation Restrictions Cleaning;Community Activity;Driving;Meal Prep;Occupation;Yard Work;Shop    Stability/Clinical Decision Making Stable/Uncomplicated    Rehab Potential Good    PT Frequency Other (comment)   1-2x/week for total of 12 vistis over 8 week certification   PT Duration 8 weeks    PT Treatment/Interventions ADLs/Self Care Home Management;Aquatic Therapy;Electrical Stimulation;Cryotherapy;Moist Heat;Balance training;Therapeutic exercise;Therapeutic activities;Functional mobility training;Stair training;Gait training;Neuromuscular re-education;Patient/family education;Manual techniques;Dry needling;Passive range of motion;Joint Manipulations    PT Next Visit Plan weight shifts, ankle ROM, wean from boot, AROM/PROM/Isometrics    PT Home Exercise Plan ankle pumps, ankle circles, compression sock, self massage to lower leg; seated heel raises, walking wiht stick/cane and seated tibial translation exercise; 01/08/21 - ankle alphabet, towel slides DF/PF/IN/EV; 2/8 lateral and tandem stepping    Consulted and Agree with Plan of Care Patient           Patient will benefit from skilled therapeutic intervention in order to improve the following deficits and impairments:  Abnormal gait,Decreased endurance,Increased edema,Decreased activity tolerance,Decreased strength,Pain,Decreased balance,Decreased mobility,Difficulty walking,Decreased range of motion  Visit Diagnosis: Difficulty in walking, not elsewhere classified  Muscle  weakness (generalized)  Pain in right ankle and joints of right foot     Problem List There are no problems to display for this patient.  10:41 AM, 01/13/21 Tereasa Coop, DPT Physical Therapy with [facility]  [phone] office  [facility] [address] [place], [place], [address] Phone: [phone]   Fax:  [phone]  Name: Craig Bowers MRN: [id number] Date of Birth: 05/30/1986

## 2021-01-14 ENCOUNTER — Ambulatory Visit: Payer: Self-pay | Admitting: Orthopedic Surgery

## 2021-01-19 ENCOUNTER — Ambulatory Visit (HOSPITAL_COMMUNITY): Payer: Worker's Compensation

## 2021-01-20 ENCOUNTER — Ambulatory Visit (HOSPITAL_COMMUNITY): Payer: Self-pay | Attending: Orthopedic Surgery | Admitting: Physical Therapy

## 2021-01-20 ENCOUNTER — Other Ambulatory Visit: Payer: Self-pay

## 2021-01-20 DIAGNOSIS — M6281 Muscle weakness (generalized): Secondary | ICD-10-CM | POA: Insufficient documentation

## 2021-01-20 DIAGNOSIS — R262 Difficulty in walking, not elsewhere classified: Secondary | ICD-10-CM | POA: Insufficient documentation

## 2021-01-20 DIAGNOSIS — M25571 Pain in right ankle and joints of right foot: Secondary | ICD-10-CM | POA: Insufficient documentation

## 2021-01-20 NOTE — Therapy (Signed)
[facility] [address] [place], [place], [address] Phone: [phone]   Fax:  [phone]  Physical Therapy Treatment  Patient Details  Name: Craig Bowers MRN: [id number] Date of Birth: [birth year] Referring Provider (PT): MD Thane Edu   Encounter Date: 01/20/2021   PT End of Session - 01/20/21 1553    Visit Number 5    Number of Visits 12    Date for PT Re-Evaluation 02/18/21    Authorization Type self pay    Progress Note Due on Visit 10    PT Start Time 1450    PT Stop Time 1528    PT Time Calculation (min) 38 min    Activity Tolerance Patient limited by pain    Behavior During Therapy [facility] for tasks assessed/performed           No past medical history on file.  No past surgical history on file.  There were no vitals filed for this visit.   Subjective Assessment - 01/20/21 1452    Subjective pt states it was hurting more yesterday from being up on it more at 5/10.  Today it is reduced to 2/10.  STates he has been compliant with HEP.  Reports standing for long periods of time irirtates it the most.    Currently in Pain? Yes    Pain Location Ankle    Pain Orientation Right    Pain Descriptors / Indicators Aching    Pain Type Acute pain                             [facility] Adult PT Treatment/Exercise - 01/20/21 0001      Manual Therapy   Manual Therapy Soft tissue mobilization    Manual therapy comments completed in prone    Soft tissue mobilization softly to distal achilles tendon to reduce pain and edema.      Ankle Exercises: Standing   BAPS Level 2;Standing;10 reps    SLS max of 10" without UE assist    Heel Raises Both;15 reps    Toe Raise 15 reps    Other Standing Ankle Exercises tandem stance 30" each. tandem walking 2RT                    PT Short Term Goals - 01/08/21 0841      PT SHORT TERM GOAL #1   Title Patient will be able to ambulate at least 226 feet in 2 minutes  in regular tennis shoes to demonstrate improved walking endurance out of boot.    Time 4    Period Weeks    Status On-going    Target Date 01/21/21      PT SHORT TERM GOAL #2   Title Patient will report at least 50% improvement in overall symptoms and/or function to demonstrate improved functional mobility    Time 4    Period Weeks    Status On-going    Target Date 01/21/21      PT SHORT TERM GOAL #3   Title Patient will be independent in self management strategies to improve quality of life and functional outcomes.    Time 4    Period Weeks    Status On-going    Target Date 01/21/21             PT Long Term Goals - 01/08/21 0841      PT LONG TERM GOAL #1  Title Patient will be able to ascend and descend stairs with use of railing as needed and in regular shoes with step through gait pattern to improve ability to go up and down stairs at work.    Time 8    Period Weeks    Status On-going      PT LONG TERM GOAL #2   Title Patient will report at least 75% improvement in overall symptoms and/or function to demonstrate improved functional mobility    Time 8    Period Weeks    Status On-going      PT LONG TERM GOAL #3   Title Patient will improve on FOTO score to meet predicted outcomes to demonstrate improved functional mobility.    Time 8    Period Weeks    Status On-going                 Plan - 01/20/21 1552    Clinical Impression Statement Progressed to standing exercises this session; wearing compression stocking, which has helped.  No pain with heel/toe raises or standing stab exercises.  Began BAP on level 2 with a little discomfort, however able to complete without issues.  Ended session with soft tissue massage to posterior LE to reduce pain and edema.  Pt very tender over medial insertion with slight bulge in this area as well.   Overall improvement at end of session.    Personal Factors and Comorbidities Transportation    Examination-Activity  Limitations Squat;Stairs;Stand;Transfers;Locomotion Level;Lift;Caring for Others    Examination-Participation Restrictions Cleaning;Community Activity;Driving;Meal Prep;Occupation;Yard Work;Shop    Stability/Clinical Decision Making Stable/Uncomplicated    Rehab Potential Good    PT Frequency Other (comment)   1-2x/week for total of 12 vistis over 8 week certification   PT Duration 8 weeks    PT Treatment/Interventions ADLs/Self Care Home Management;Aquatic Therapy;Electrical Stimulation;Cryotherapy;Moist Heat;Balance training;Therapeutic exercise;Therapeutic activities;Functional mobility training;Stair training;Gait training;Neuromuscular re-education;Patient/family education;Manual techniques;Dry needling;Passive range of motion;Joint Manipulations    PT Next Visit Plan continue to progress with addition of therex in painfree range of motion; manual as needed as well.    PT Home Exercise Plan ankle pumps, ankle circles, compression sock, self massage to lower leg; seated heel raises, walking wiht stick/cane and seated tibial translation exercise; 01/08/21 - ankle alphabet, towel slides DF/PF/IN/EV; 2/8 lateral and tandem stepping    Consulted and Agree with Plan of Care Patient           Patient will benefit from skilled therapeutic intervention in order to improve the following deficits and impairments:  Abnormal gait,Decreased endurance,Increased edema,Decreased activity tolerance,Decreased strength,Pain,Decreased balance,Decreased mobility,Difficulty walking,Decreased range of motion  Visit Diagnosis: Muscle weakness (generalized)  Pain in right ankle and joints of right foot  Difficulty in walking, not elsewhere classified     Problem List There are no problems to display for this patient.  Lurena Nida, PTA/CLT [phone]  Lurena Nida 01/20/2021, 3:54 PM  [facility] [address] [place], [place], [address] Phone:  [phone]   Fax:  [phone]  Name: Craig Bowers MRN: [id number] Date of Birth: March 27, 1986

## 2021-01-27 ENCOUNTER — Encounter (HOSPITAL_COMMUNITY): Payer: Self-pay

## 2021-01-27 ENCOUNTER — Ambulatory Visit (HOSPITAL_COMMUNITY): Payer: Self-pay

## 2021-01-27 ENCOUNTER — Other Ambulatory Visit: Payer: Self-pay

## 2021-01-27 DIAGNOSIS — M25571 Pain in right ankle and joints of right foot: Secondary | ICD-10-CM

## 2021-01-27 DIAGNOSIS — R262 Difficulty in walking, not elsewhere classified: Secondary | ICD-10-CM

## 2021-01-27 DIAGNOSIS — M6281 Muscle weakness (generalized): Secondary | ICD-10-CM

## 2021-01-27 NOTE — Therapy (Signed)
[place] [facility] [address] [place], [place], [address] Phone: [phone]   Fax:  [phone]  Physical Therapy Treatment  Patient Details  Name: Craig Bowers MRN: [id number] Date of Birth: [birth year] Referring Provider (PT): MD Thane Edu   Encounter Date: 01/27/2021   PT End of Session - 01/27/21 0839    Visit Number 6    Number of Visits 12    Date for PT Re-Evaluation 02/18/21    Authorization Type self pay    Progress Note Due on Visit 10    PT Start Time 0833    PT Stop Time 0915    PT Time Calculation (min) 42 min    Activity Tolerance Patient tolerated treatment well    Behavior During Therapy [facility] for tasks assessed/performed           History reviewed. No pertinent past medical history.  History reviewed. No pertinent surgical history.  There were no vitals filed for this visit.   Subjective Assessment - 01/27/21 0837    Subjective Pt stated the pain comes and goes.  Reports most difficulty standing for long periods of time, around a hour.    How long can you stand comfortably? 01/27/21: able to stand for an hour    Currently in Pain? No/denies    Pain Onset More than a month ago    Pain Frequency Constant    Aggravating Factors  weightbearing, ROM    Pain Relieving Factors rest, Ibuprofen, ice              [facility] PT Assessment - 01/27/21 0001      Assessment   Medical Diagnosis right achille's tear    Referring Provider (PT) MD Thane Edu    Onset Date/Surgical Date 11/17/20    Next MD Visit 01/30/21      AROM   Right Ankle Dorsiflexion 6   pain free; was 1 degree   Right Ankle Plantar Flexion 30   pain free   Right Ankle Inversion 20   pain free; was pain with movement   Right Ankle Eversion 7   pain free; was painful movement                        [facility] Adult PT Treatment/Exercise - 01/27/21 0001      Ambulation/Gait   Ambulation/Gait Yes    Ambulation Distance (Feet) 256 Feet     Assistive device None    Gait Pattern Decreased step length - left;Decreased stride length;Decreased dorsiflexion - right;Decreased stance time - right;Decreased weight shift to right;Right foot flat;Antalgic    Ambulation Surface Level;Indoor    Gait velocity reduced    Gait Comments 2MWT;with regular tennis shoes.  Reports pain 2/10 with heel strike and stance phase      Ankle Exercises: Standing   BAPS Level 2;Standing;10 reps    Rocker Board 2 minutes   lateral   Heel Raises Both;15 reps      Ankle Exercises: Stretches   Slant Board Stretch 3 reps;30 seconds                    PT Short Term Goals - 01/27/21 0855      PT SHORT TERM GOAL #1   Title Patient will be able to ambulate at least 226 feet in 2 minutes in regular tennis shoes to demonstrate improved walking endurance out of boot.      PT SHORT TERM GOAL #  2   Title Patient will report at least 50% improvement in overall symptoms and/or function to demonstrate improved functional mobility    Baseline 01/27/21:  Reports he has improved by 85%, feels he has good and bad days.  Most limited by standing for long periods of time.    Status Achieved      PT SHORT TERM GOAL #3   Title Patient will be independent in self management strategies to improve quality of life and functional outcomes.    Baseline 01/27/21:  Reports compliance with HEP daily, pain medication and elevation/ice assist wiht pain    Status On-going             PT Long Term Goals - 01/27/21 0857      PT LONG TERM GOAL #1   Title Patient will be able to ascend and descend stairs with use of railing as needed and in regular shoes with step through gait pattern to improve ability to go up and down stairs at work.    Status On-going      PT LONG TERM GOAL #2   Title Patient will report at least 75% improvement in overall symptoms and/or function to demonstrate improved functional mobility    Status On-going      PT LONG TERM GOAL #3   Title  Patient will improve on FOTO score to meet predicted outcomes to demonstrate improved functional mobility.    Status On-going                 Plan - 01/27/21 0850    Clinical Impression Statement Added rockerboard to equalize weight distribution during gait.  Standing exercises were complete for mobility and strengthening in pain free range.  Pt demonstrates difficult with coordination wiht Baps board, no reports of pain.  Reviewed STGs prior return to MD on Friday.  Increased distance with , reports pain with heel strike and stance phase.  Reports he feels 85% improvements with therapy.  ROM assessed with improved range and pain free movements.    Personal Factors and Comorbidities Transportation    Examination-Activity Limitations Squat;Stairs;Stand;Transfers;Locomotion Level;Lift;Caring for Others    Examination-Participation Restrictions Cleaning;Community Activity;Driving;Meal Prep;Occupation;Yard Work;Shop    Stability/Clinical Decision Making Stable/Uncomplicated    Clinical Decision Making Low    Rehab Potential Good    PT Duration 8 weeks    PT Treatment/Interventions ADLs/Self Care Home Management;Aquatic Therapy;Electrical Stimulation;Cryotherapy;Moist Heat;Balance training;Therapeutic exercise;Therapeutic activities;Functional mobility training;Stair training;Gait training;Neuromuscular re-education;Patient/family education;Manual techniques;Dry needling;Passive range of motion;Joint Manipulations    PT Next Visit Plan continue to progress with addition of therex in painfree range of motion; manual as needed as well.    PT Home Exercise Plan ankle pumps, ankle circles, compression sock, self massage to lower leg; seated heel raises, walking wiht stick/cane and seated tibial translation exercise; 01/08/21 - ankle alphabet, towel slides DF/PF/IN/EV; 2/8 lateral and tandem stepping    Consulted and Agree with Plan of Care Patient           Patient will benefit from skilled  therapeutic intervention in order to improve the following deficits and impairments:  Abnormal gait,Decreased endurance,Increased edema,Decreased activity tolerance,Decreased strength,Pain,Decreased balance,Decreased mobility,Difficulty walking,Decreased range of motion  Visit Diagnosis: Muscle weakness (generalized)  Pain in right ankle and joints of right foot  Difficulty in walking, not elsewhere classified     Problem List There are no problems to display for this patient.  Becky Sax, LPTA/CLT; CBIS [phone]  Juel Burrow 01/27/2021, 9:21 AM  [place] [facility] Boss  [facility] [address] [place], [place], [address] Phone: [phone]   Fax:  [phone]  Name: AMARA MANALANG MRN: [id number] Date of Birth: 08-16-1986

## 2021-01-30 ENCOUNTER — Other Ambulatory Visit: Payer: Self-pay

## 2021-01-30 ENCOUNTER — Encounter: Payer: Self-pay | Admitting: Orthopedic Surgery

## 2021-01-30 ENCOUNTER — Ambulatory Visit (INDEPENDENT_AMBULATORY_CARE_PROVIDER_SITE_OTHER): Payer: Worker's Compensation | Admitting: Orthopedic Surgery

## 2021-01-30 VITALS — BP 152/109 | HR 86 | Ht 71.0 in | Wt 239.0 lb

## 2021-01-30 DIAGNOSIS — S86001D Unspecified injury of right Achilles tendon, subsequent encounter: Secondary | ICD-10-CM

## 2021-01-30 NOTE — Progress Notes (Signed)
Orthopaedic Clinic Return  Assessment: Craig Bowers is a 35 y.o. male with the following: Partial right Achilles tendon injury; myotendinous junction injury  Plan: His right Achilles injury continues to improve.  He is working well physical therapy.  He is also doing exercises on his own.  At this point, he can return to work soon as he is comfortable.  He does feel he needs a little bit more time.  We will plan to see him in approximately 1 month for repeat evaluation, but if he is ready to return to work prior to that, he just has to call the clinic and we can provide him with the appropriate documentation.  Advised him to push a little bit harder with his activities.  Walking is excellent exercise, and will continue to improve his injury and his strength in his right lower extremity.  He should be cognizant of the surface in which he is walking, and plan to avoid uneven or slippery surfaces.  All questions were answered is amenable this plan  Follow-up: Return in about 4 weeks (around 02/27/2021).   Subjective:  Chief Complaint  Patient presents with  . Ankle Pain    Right, Patient reports doing better,     History of Present Illness: Craig Bowers is a 35 y.o. male who returns to clinic for repeat evaluation of his right Achilles injury.,  He has done well since last time we saw him in clinic.  His physical therapy is improving.  He states his therapist is happy with his progression.  He is wearing a regular shoe on a consistent basis, but does occasionally resort to the boot.  He notes that if he walks for longer periods of time he has worse pain.  He also notes that he has some swelling in his right foot if he is on his feet for longer periods of time.  He is taking NSAIDs as needed, and has completed his course of aspirin for DVT prophylaxis.   Review of Systems: No fevers or chills No numbness or tingling No chest pain No shortness of breath No bowel or bladder  dysfunction No GI distress No headaches  Objective: BP (!) 152/109   Pulse 86   Ht 5\' 11"  (1.803 m)   Wt 239 lb (108.4 kg)   BMI 33.33 kg/m   Physical Exam:  Evaluation of the right lower extremity including his calf demonstrates no swelling.  He does have mild tenderness to palpation at the insertion of the Achilles, as well as at the myotendinous junction.  This is significantly improved.  Approximately 5 degrees of dorsiflexion with his knee fully extended.  He walks with a slight right sided antalgic gait.  Strength is 4+/5 in the gastroc/soleus complex.  Sensation is intact distally.  Toes are warm and well perfused.  He continues to have some mild atrophy of his gastroc.  IMAGING: I personally ordered and reviewed the following images:  No new imaging obtained.   , MD 01/30/2021 9:42 AM

## 2021-02-02 ENCOUNTER — Ambulatory Visit (HOSPITAL_COMMUNITY): Payer: Self-pay

## 2021-02-04 ENCOUNTER — Telehealth: Payer: Self-pay | Admitting: Orthopedic Surgery

## 2021-02-04 NOTE — Telephone Encounter (Addendum)
Call received via voice message from patient's employer, [employer] relaying that he would like to speak with Dr Dallas Schimke. Not seeing designated party release of information. Please advise.

## 2021-02-04 NOTE — Telephone Encounter (Signed)
(  continued) his ph# is [phone]

## 2021-02-04 NOTE — Telephone Encounter (Signed)
I called patient Craig Bowers asking him to call me.  We cannot call employer and discuss patient without release from patient to do so. Will discuss with patient when he calls me back.

## 2021-02-10 ENCOUNTER — Ambulatory Visit (HOSPITAL_COMMUNITY): Payer: Self-pay | Attending: Orthopedic Surgery | Admitting: Physical Therapy

## 2021-02-10 ENCOUNTER — Encounter (HOSPITAL_COMMUNITY): Payer: Self-pay | Admitting: Physical Therapy

## 2021-02-10 ENCOUNTER — Other Ambulatory Visit: Payer: Self-pay

## 2021-02-10 DIAGNOSIS — M6281 Muscle weakness (generalized): Secondary | ICD-10-CM | POA: Insufficient documentation

## 2021-02-10 DIAGNOSIS — R262 Difficulty in walking, not elsewhere classified: Secondary | ICD-10-CM | POA: Insufficient documentation

## 2021-02-10 DIAGNOSIS — M25571 Pain in right ankle and joints of right foot: Secondary | ICD-10-CM | POA: Insufficient documentation

## 2021-02-10 NOTE — Therapy (Signed)
[place] [address] [place], [place], [address] Phone: [phone]   Fax:  [phone]  Physical Therapy Treatment and Discharge Note  Patient Details  Name: Craig Bowers MRN: [id number] Date of Birth: 1986-02-28 Referring Provider (PT): MD Larena Glassman  PHYSICAL THERAPY DISCHARGE SUMMARY  Visits from Start of Care: 7  Current functional level related to goals / functional outcomes: See below    Remaining deficits: Continued pain and endurance challenges   Education / Equipment: See below  Plan: Patient agrees to discharge.  Patient goals were partially met. Patient is being discharged due to being pleased with the current functional level.  ?????           Encounter Date: 02/10/2021   PT End of Session - 02/10/21 0929    Visit Number 7    Number of Visits 12    Date for PT Re-Evaluation 02/18/21    Authorization Type self pay    Progress Note Due on Visit 10    PT Start Time 0930   pt 14 minutes late to session   PT Stop Time 1008    PT Time Calculation (min) 38 min    Activity Tolerance Patient tolerated treatment well    Behavior During Therapy [facility] for tasks assessed/performed           History reviewed. No pertinent past medical history.  History reviewed. No pertinent surgical history.  There were no vitals filed for this visit.   Subjective Assessment - 02/10/21 0933    Subjective States that he feels 90% better. STates that he saw the MD and he wants him to push through things. States that he still has occassional pain but that it's not that bad more of an ache. States that the MD wants him to walk more, currently walking about 15 minutes at a time. Reports no difficulties with exercises.    How long can you stand comfortably? 01/27/21: able to stand for an hour    Currently in Pain? No/denies    Pain Onset More than a month ago              [facility] PT Assessment - 02/10/21 0001      Assessment    Medical Diagnosis right achille's tear    Referring Provider (PT) MD Larena Glassman    Onset Date/Surgical Date 11/17/20    Next MD Visit 02/27/21      Observation/Other Assessments   Focus on Therapeutic Outcomes (FOTO)  57% function, predicted 68% function, was 36% function      AROM   Right Ankle Dorsiflexion 8   no tightness reported   Right Ankle Plantar Flexion 30   tightness along back of leg   Right Ankle Inversion 18    Right Ankle Eversion 5      Strength   Right Ankle Dorsiflexion 4/5   slight pain in back of leg   Right Ankle Plantar Flexion 2/5    Right Ankle Inversion 4+/5    Right Ankle Eversion 4+/5    Left Ankle Dorsiflexion 5/5    Left Ankle Plantar Flexion 5/5    Left Ankle Inversion 5/5    Left Ankle Eversion 5/5      Ambulation/Gait   Ambulation/Gait Yes    Ambulation Distance (Feet) 464 Feet    Assistive device None    Gait Pattern Decreased step length - left;Decreased stride length;Decreased dorsiflexion - right;Decreased stance time - right;Decreased weight shift to right;Right  foot flat;Antalgic    Ambulation Surface Level;Indoor    Gait velocity reduced    Stairs Yes    Stairs Assistance 6: Modified independent (Device/Increase time)    Stair Management Technique No rails;Alternating pattern    Number of Stairs 4    Height of Stairs 7    Gait Comments 2MW with regular tennis shoes                                 PT Education - 02/10/21 1029    Education Details on progress made, on FOTO score, on walking program, HEP and plan moving forward    Person(s) Educated Patient    Methods Explanation    Comprehension Verbalized understanding            PT Short Term Goals - 02/10/21 0935      PT SHORT TERM GOAL #1   Title Patient will be able to ambulate at least 226 feet in 2 minutes in regular tennis shoes to demonstrate improved walking endurance out of boot.    Status Achieved      PT SHORT TERM GOAL #2   Title  Patient will report at least 50% improvement in overall symptoms and/or function to demonstrate improved functional mobility    Baseline --    Status Achieved      PT SHORT TERM GOAL #3   Title Patient will be independent in self management strategies to improve quality of life and functional outcomes.    Baseline --    Status Achieved             PT Long Term Goals - 02/10/21 0935      PT LONG TERM GOAL #1   Title Patient will be able to ascend and descend stairs with use of railing as needed and in regular shoes with step through gait pattern to improve ability to go up and down stairs at work.    Status Achieved      PT LONG TERM GOAL #2   Title Patient will report at least 75% improvement in overall symptoms and/or function to demonstrate improved functional mobility    Status Achieved      PT LONG TERM GOAL #3   Title Patient will improve on FOTO score to meet predicted outcomes to demonstrate improved functional mobility.    Status On-going                 Plan - 02/10/21 1038    Clinical Impression Statement Patient with recent MD visit and would like to transition to home program at this time. Reviewed current HEP and answered all questions. Educated patient on walking program to increase endurance and about pushing himself forward to improve PF moment with walking. All short term goals met and all but FOTO goal met at this time but patient is on his way to meet expected outcome on FOTO survey. Patient to discharge from PT to HEP at this time secondary to patient request and independence in HEP.    Personal Factors and Comorbidities Transportation    Examination-Activity Limitations Squat;Stairs;Stand;Transfers;Locomotion Level;Lift;Caring for Others    Examination-Participation Restrictions Cleaning;Community Activity;Driving;Meal Prep;Occupation;Yard Work;Shop    Stability/Clinical Decision Making Stable/Uncomplicated    Rehab Potential Good    PT Duration 8  weeks    PT Treatment/Interventions ADLs/Self Care Home Management;Aquatic Therapy;Electrical Stimulation;Cryotherapy;Moist Heat;Balance training;Therapeutic exercise;Therapeutic activities;Functional mobility training;Stair training;Gait training;Neuromuscular re-education;Patient/family education;Manual techniques;Dry needling;Passive range  of motion;Joint Manipulations    PT Next Visit Plan pt to DC to HEP at this time    PT Home Exercise Plan ankle pumps, ankle circles, compression sock, self massage to lower leg; seated heel raises, walking wiht stick/cane and seated tibial translation exercise; 01/08/21 - ankle alphabet, towel slides DF/PF/IN/EV; 2/8 lateral and tandem stepping    Consulted and Agree with Plan of Care Patient           Patient will benefit from skilled therapeutic intervention in order to improve the following deficits and impairments:  Abnormal gait,Decreased endurance,Increased edema,Decreased activity tolerance,Decreased strength,Pain,Decreased balance,Decreased mobility,Difficulty walking,Decreased range of motion  Visit Diagnosis: Muscle weakness (generalized)  Pain in right ankle and joints of right foot  Difficulty in walking, not elsewhere classified     Problem List There are no problems to display for this patient.  10:38 AM, 02/10/21 Jerene Pitch, DPT Physical Therapy with [facility]  [phone] office  [place] [address] [place], [place], [address] Phone: [phone]   Fax:  [phone]  Name: KASON BENAK MRN: [id number] Date of Birth: 07/14/1986

## 2021-02-27 ENCOUNTER — Other Ambulatory Visit: Payer: Self-pay

## 2021-02-27 ENCOUNTER — Encounter: Payer: Self-pay | Admitting: Orthopedic Surgery

## 2021-02-27 ENCOUNTER — Ambulatory Visit (INDEPENDENT_AMBULATORY_CARE_PROVIDER_SITE_OTHER): Payer: Worker's Compensation | Admitting: Orthopedic Surgery

## 2021-02-27 VITALS — BP 152/99 | HR 70 | Ht 71.0 in | Wt 236.0 lb

## 2021-02-27 DIAGNOSIS — S86001D Unspecified injury of right Achilles tendon, subsequent encounter: Secondary | ICD-10-CM | POA: Diagnosis not present

## 2021-02-27 NOTE — Patient Instructions (Signed)
OK to return to work.  Limit lifting to 20 lbs for the next month and then return to full duty

## 2021-02-27 NOTE — Progress Notes (Signed)
Orthopaedic Clinic Return  Assessment: Craig Bowers is a 35 y.o. male with the following: Partial right Achilles tendon injury; myotendinous junction injury  Plan: He continues to improve.  He feels much better.  He notes some pain if he stands for extended periods.  On physical exam, he has some atrophy of his calf on the right compared to the left.  This will continue to improve.  As his strength and endurance improves, the aching sensation he has will also improve.  He is ok to return to work, but we will limit his lifting to 20 lbs for the next month, and then full return.  Explained that full recovery is likely 6-9 months.  Follow up as needed.   Follow-up: Return if symptoms worsen or fail to improve.   Subjective:  Chief Complaint  Patient presents with  . Leg Pain    Right lower leg, patient reports doing,     History of Present Illness: Craig Bowers is a 35 y.o. male who returns to clinic for repeat evaluation of his right Achilles injury.,  He continues to improve.  He feels better.  He has some aching in his calf if he is standing for extended periods of time.  Pain is better.  He is taking occasional ibuprofen with good effect.    Review of Systems: No fevers or chills No numbness or tingling No chest pain No shortness of breath No bowel or bladder dysfunction No GI distress No headaches  Objective: BP (!) 152/99   Pulse 70   Ht 5\' 11"  (1.803 m)   Wt 236 lb (107 kg)   BMI 32.92 kg/m   Physical Exam:  Right lower leg without swelling and deformity.  Mild tenderness over the distal calf and Achilles.  Atrophy of the right calf.    He walks with a slight right sided antalgic gait.   Strength is 4+/5 in the gastroc/soleus complex.   Sensation is intact distally.   Toes are warm and well perfused.     IMAGING: I personally ordered and reviewed the following images:  No new imaging obtained.   , MD 02/27/2021 9:29 AM

## 2021-03-09 IMAGING — DX DG ANKLE COMPLETE 3+V*R*
3 series · 3 of 3 positions shown · non-contrast
Comparison: None.

CLINICAL DATA: Fall twisting right ankle.

EXAM:
RIGHT ANKLE - COMPLETE 3+ VIEW

[ankle ap]
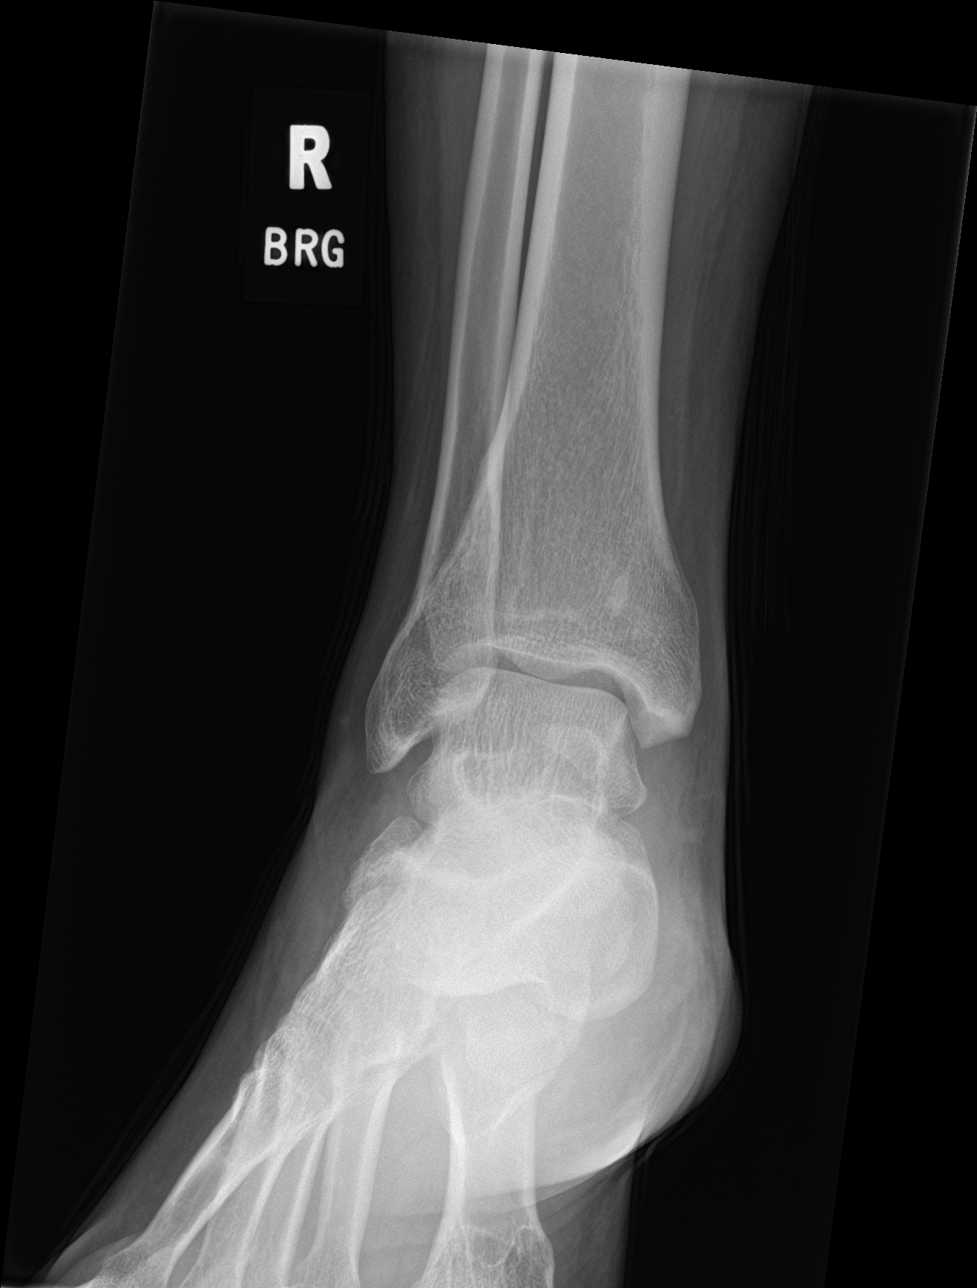

[ankle obl]
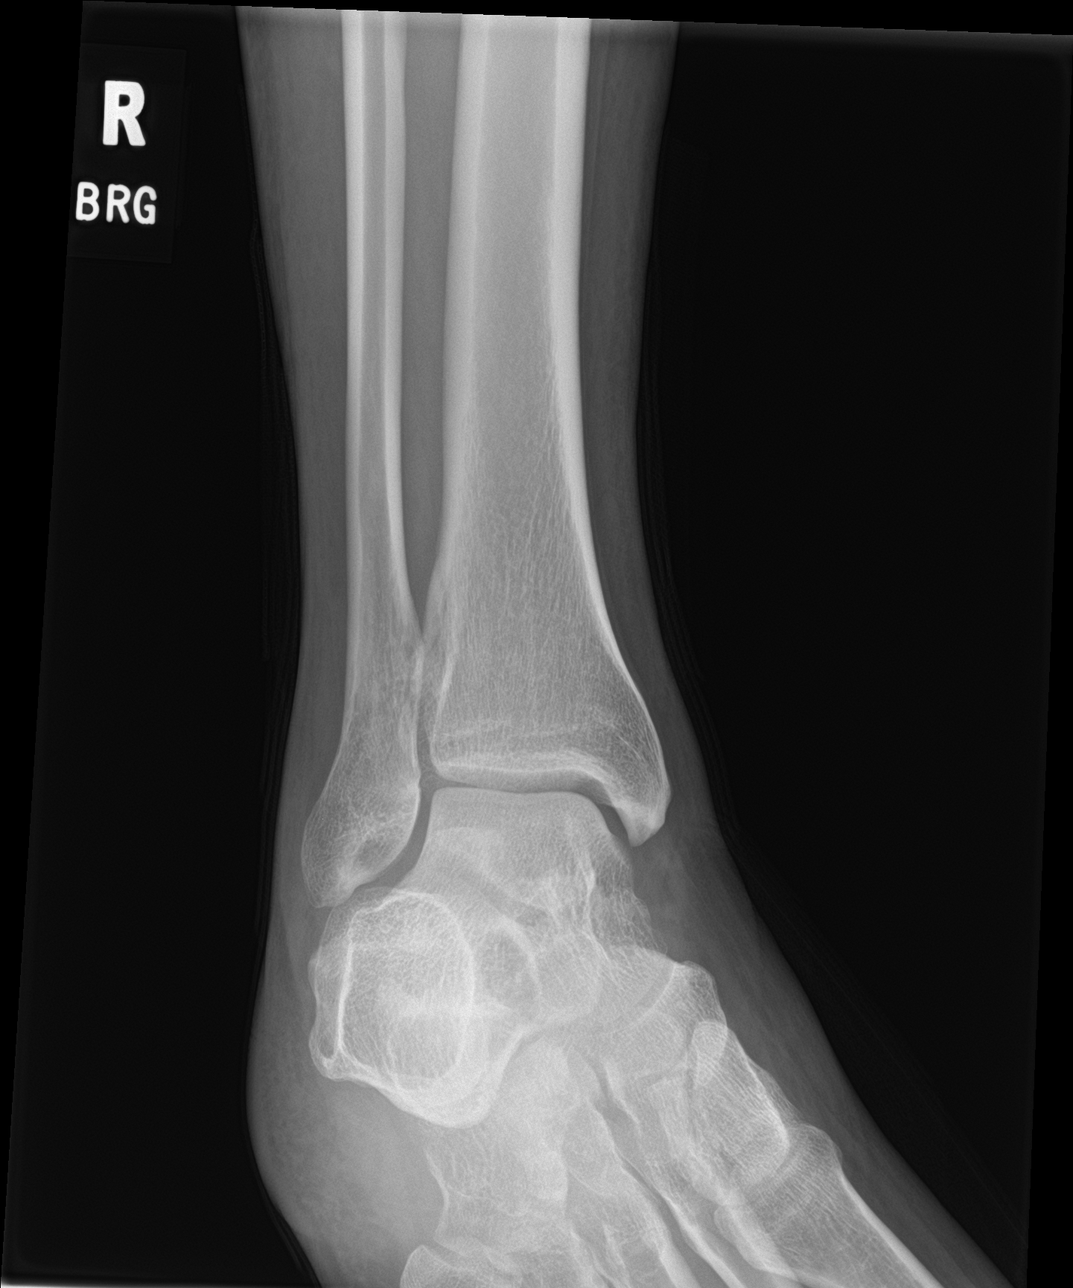

[ankle lat]
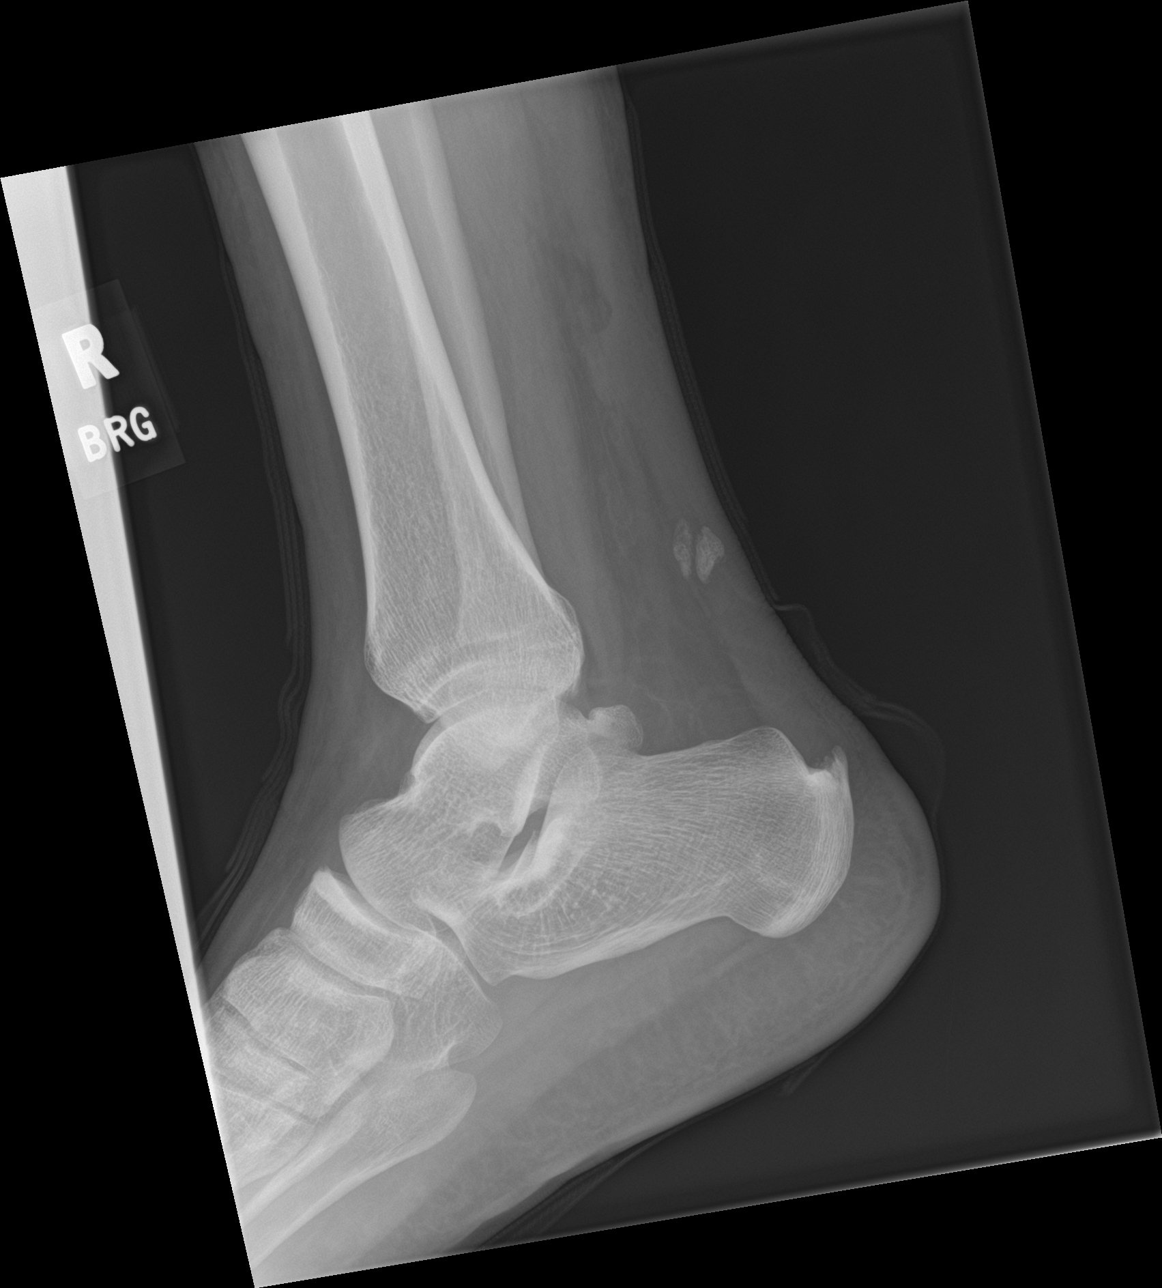

[3 of 3 positions shown; findings below may reference images not displayed]

FINDINGS: There is no evidence of fracture, dislocation, or joint effusion.
The ankle mortise is preserved. There is no evidence of arthropathy.
Probable bone island in the distal tibia. Enthesopathic changes at
the Achilles tendon insertion. There additional well-marginated
calcifications in the region of the mid Achilles tendon. No adjacent
soft tissue edema. Mild generalized soft tissue edema.
IMPRESSION: 1. Soft tissue edema without acute osseous abnormality.
2. Enthesopathic changes at the Achilles tendon insertion with
additional well-marginated calcifications in the region of the mid
Achilles tendon suggesting calcific tendinopathy. This appears
chronic.

## 2021-03-25 IMAGING — US US EXTREM LOW VENOUS*R*
1 series · 13 of 24 positions shown · non-contrast
Comparison: None.

CLINICAL DATA: Right calf pain, recent injury



[Series 1: us venous img lower uni right (dvt) · portal-venous · 13 of 35 slices shown]
[im 1/35]
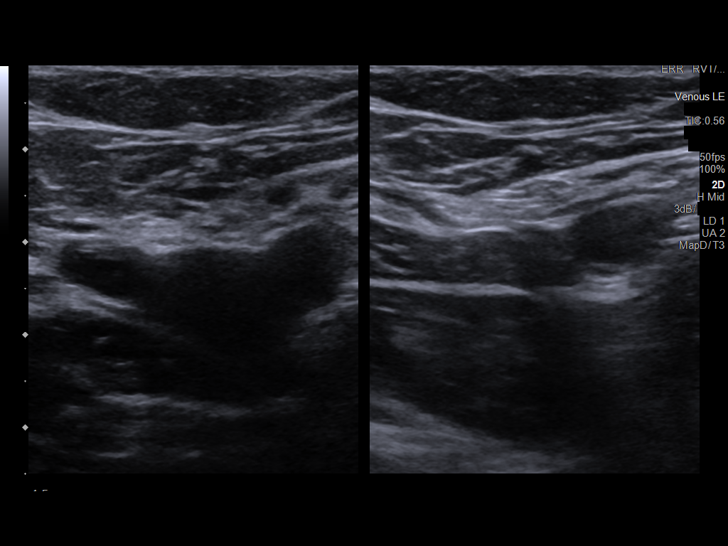
[im 3/35]
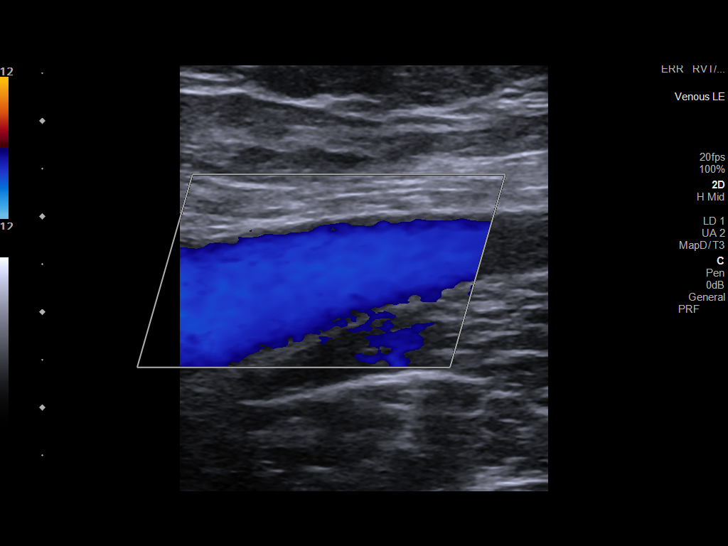
[im 6/35]
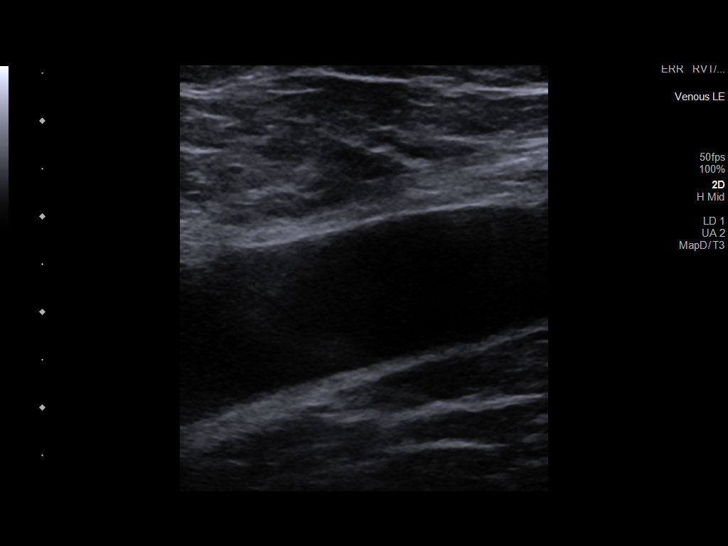
[im 9/35]
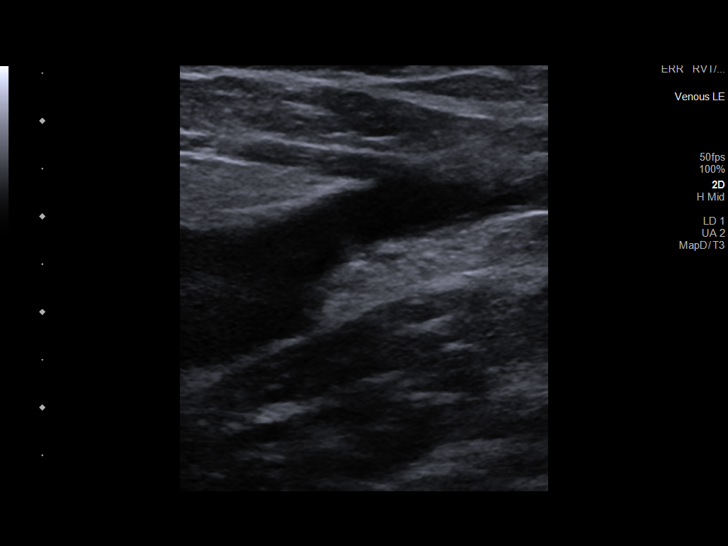
[im 12/35]
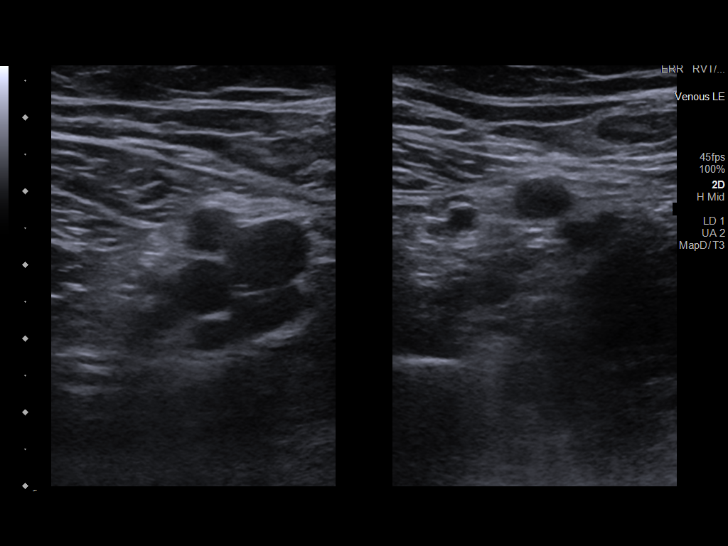
[im 15/35]
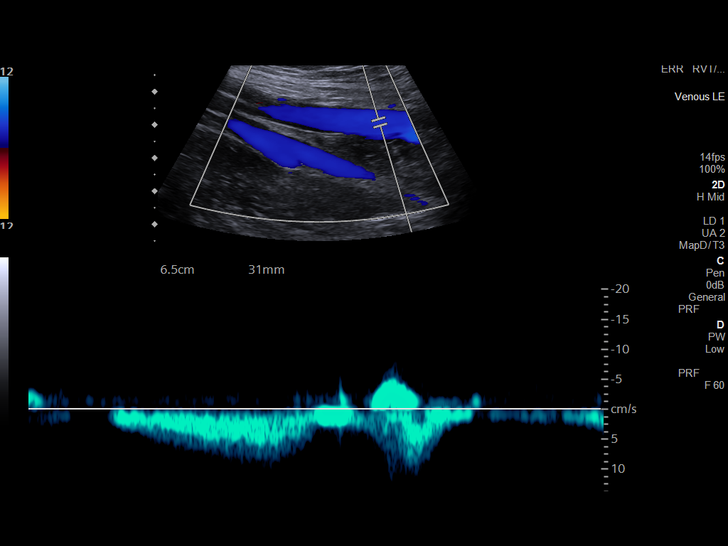
[im 18/35]
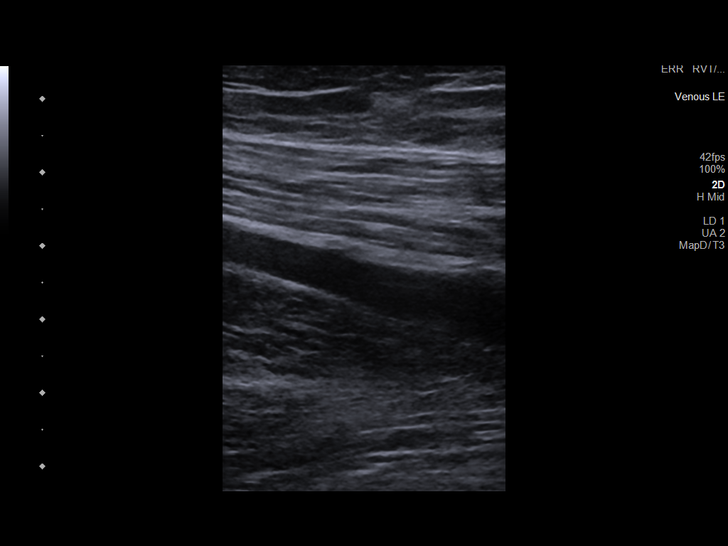
[im 20/35]
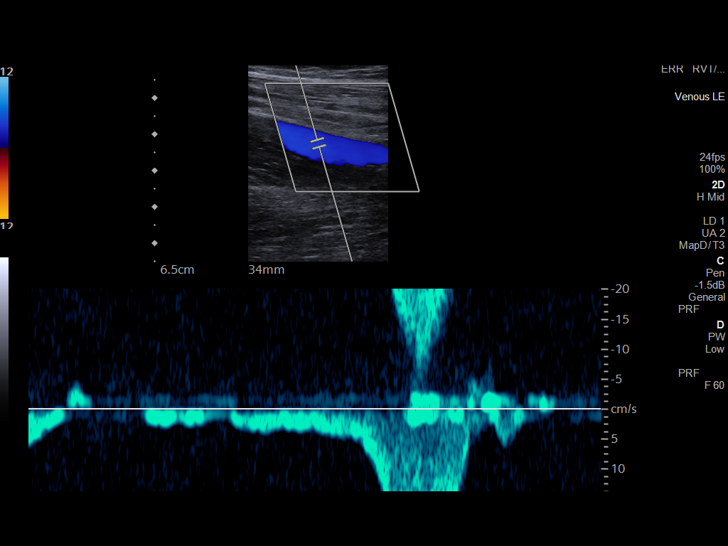
[im 23/35]
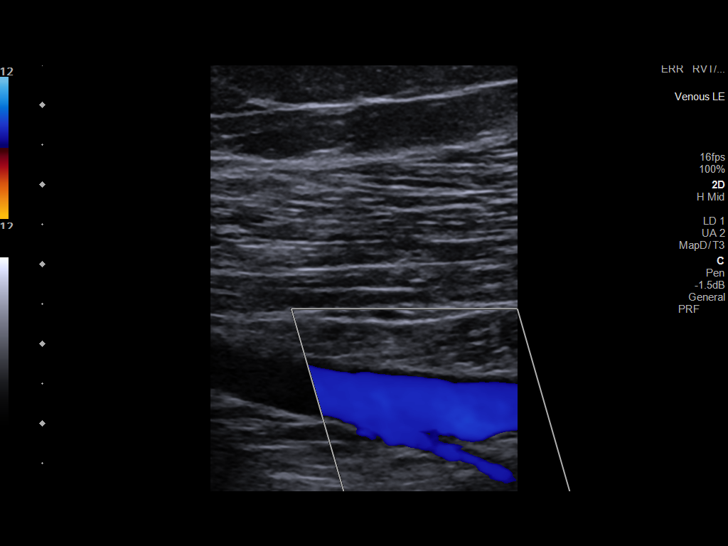
[im 26/35]
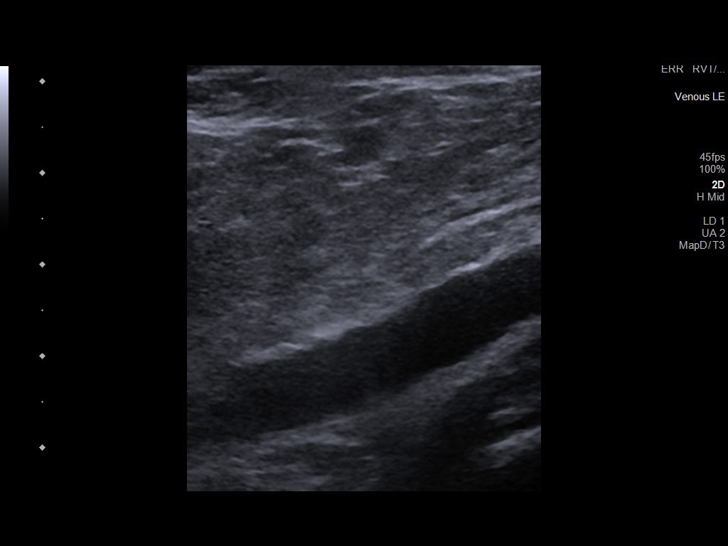
[im 29/35]
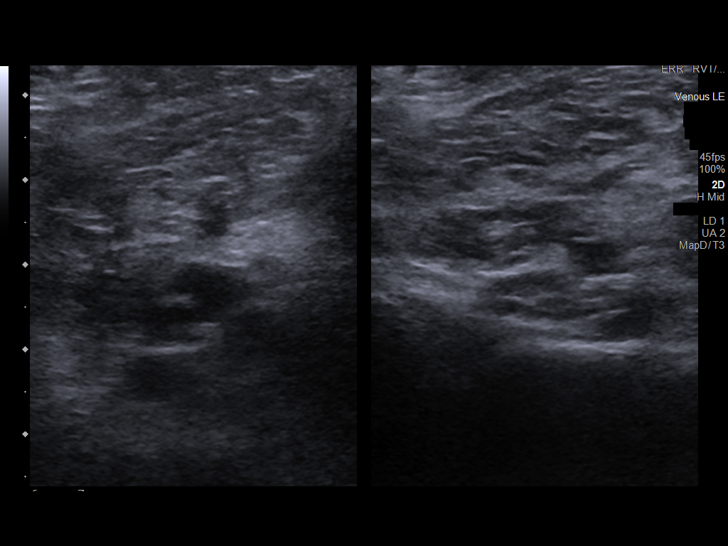
[im 32/35]
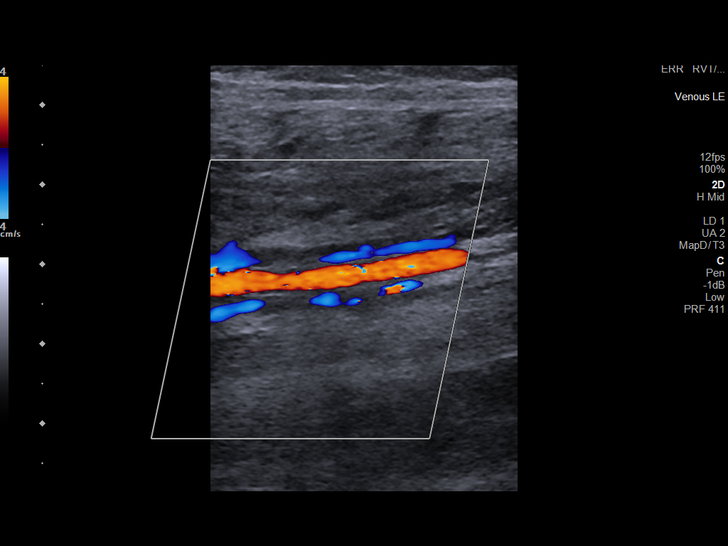
[im 35/35]
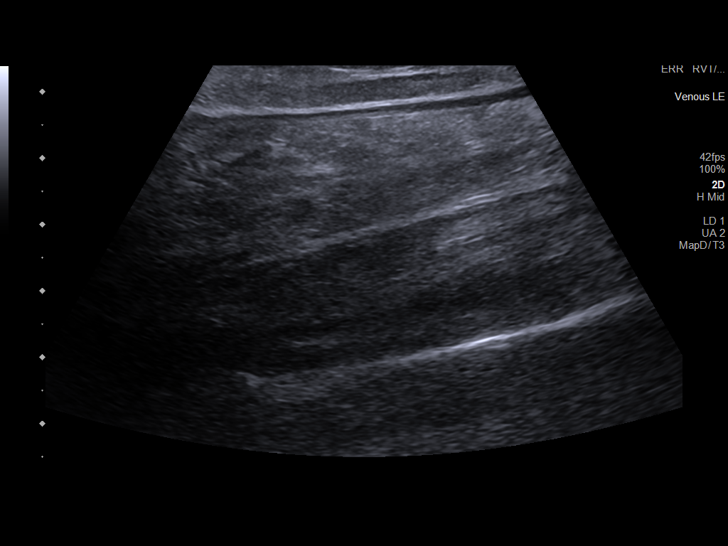

[13 of 24 positions shown; findings below may reference images not displayed]

FINDINGS: Contralateral Common Femoral Vein: Respiratory phasicity is normal
and symmetric with the symptomatic side. No evidence of thrombus.
Normal compressibility.

Common Femoral Vein: No evidence of thrombus. Normal
compressibility, respiratory phasicity and response to augmentation.

Saphenofemoral Junction: No evidence of thrombus. Normal
compressibility and flow on color Doppler imaging.

Profunda Femoral Vein: No evidence of thrombus. Normal
compressibility and flow on color Doppler imaging.

Femoral Vein: No evidence of thrombus. Normal compressibility,
respiratory phasicity and response to augmentation.

Popliteal Vein: No evidence of thrombus. Normal compressibility,
respiratory phasicity and response to augmentation.

Calf Veins: No evidence of thrombus. Normal compressibility and flow
on color Doppler imaging.
IMPRESSION: No evidence of deep venous thrombosis.

## 2021-04-03 ENCOUNTER — Other Ambulatory Visit: Payer: Self-pay

## 2021-04-03 ENCOUNTER — Ambulatory Visit (INDEPENDENT_AMBULATORY_CARE_PROVIDER_SITE_OTHER): Payer: Worker's Compensation | Admitting: Orthopedic Surgery

## 2021-04-03 ENCOUNTER — Encounter: Payer: Self-pay | Admitting: Orthopedic Surgery

## 2021-04-03 VITALS — BP 140/101 | HR 75 | Ht 71.0 in | Wt 238.0 lb

## 2021-04-03 DIAGNOSIS — S86001D Unspecified injury of right Achilles tendon, subsequent encounter: Secondary | ICD-10-CM | POA: Diagnosis not present

## 2021-04-03 NOTE — Progress Notes (Signed)
Orthopaedic Clinic Return  Assessment: Craig Bowers is a 35 y.o. male with the following: Partial right Achilles tendon injury; myotendinous junction injury  Plan: He continues to have some swelling and pain in his right calf.  Some persistent tenderness at the site of the injury.  Otherwise he notes continued improvement.  Recommend continued work with exercise.  Anticipate gradual improvement for up to 6-9 months after the injury.  He can consider Voltaren topical gel, especially at the site of most tenderness.  Reasonable to have good days and bad days, but this will improve.  Follow up as needed.    Follow-up: No follow-ups on file.   Subjective:  Chief Complaint  Patient presents with  . Leg Pain    Rt achilles tendon f/u pt states he has good and bad days.     History of Present Illness: Craig Bowers is a 35 y.o. male who returns to clinic for repeat evaluation of his right Achilles injury.  He is doing better.  He has some good days and some bad days.  He notes occasional swelling in his calf, and this is painful.  Tylenol and ibuprofen as needed.   Exercises as much as tolerated.  He is a little frustrated with slow progression overall.    Review of Systems: No fevers or chills No numbness or tingling No chest pain No shortness of breath No bowel or bladder dysfunction No GI distress No headaches  Objective: BP (!) 140/101   Pulse 75   Ht 5\' 11"  (1.803 m)   Wt 238 lb (108 kg)   BMI 33.19 kg/m   Physical Exam:  Right lower leg without swelling and deformity.  Mild tenderness over the distal calf and Achilles.  Atrophy of the right calf.    Strength is 4+/5 in the gastroc/soleus complex.   Sensation is intact distally.   Toes are warm and well perfused.     IMAGING: I personally ordered and reviewed the following images:  No new imaging obtained.   , MD 04/03/2021 9:31 PM

## 2023-02-03 ENCOUNTER — Encounter: Payer: Self-pay | Admitting: Radiology

## 2023-06-21 ENCOUNTER — Encounter: Payer: Self-pay | Admitting: Internal Medicine

## 2023-06-21 ENCOUNTER — Ambulatory Visit: Payer: BC Managed Care – PPO | Admitting: Internal Medicine

## 2023-06-21 VITALS — BP 146/86 | HR 59 | Resp 16 | Ht 71.0 in | Wt 226.0 lb

## 2023-06-21 DIAGNOSIS — Z114 Encounter for screening for human immunodeficiency virus [HIV]: Secondary | ICD-10-CM | POA: Insufficient documentation

## 2023-06-21 DIAGNOSIS — Z23 Encounter for immunization: Secondary | ICD-10-CM | POA: Diagnosis not present

## 2023-06-21 DIAGNOSIS — F17200 Nicotine dependence, unspecified, uncomplicated: Secondary | ICD-10-CM | POA: Diagnosis not present

## 2023-06-21 DIAGNOSIS — Z1159 Encounter for screening for other viral diseases: Secondary | ICD-10-CM | POA: Diagnosis not present

## 2023-06-21 DIAGNOSIS — Z13220 Encounter for screening for lipoid disorders: Secondary | ICD-10-CM | POA: Diagnosis not present

## 2023-06-21 DIAGNOSIS — Z131 Encounter for screening for diabetes mellitus: Secondary | ICD-10-CM | POA: Insufficient documentation

## 2023-06-21 MED ORDER — VARENICLINE TARTRATE (STARTER) 0.5 MG X 11 & 1 MG X 42 PO TBPK: ORAL_TABLET | ORAL | 0 refills | Status: AC

## 2023-06-21 MED ORDER — NICOTINE 14 MG/24HR TD PT24: 14.0000 mg | MEDICATED_PATCH | Freq: Every day | TRANSDERMAL | 1 refills | Status: AC

## 2023-06-21 NOTE — Assessment & Plan Note (Signed)
Patient has smoked since 2004 , Has 20.5 pack years. Currently smoking half pack per day.   Asked about quitting: confirms that he currently smokes cigarettes Advise to quit smoking: Educated about QUITTING to reduce the risk of cancer, cardio and cerebrovascular disease. Assess willingness: willing to quit at this time Assist with counseling and pharmacotherapy: Counseled for 5 minutes and prescribed Chantix and Nicotine patches Arrange for follow up: follow up scheduled in 6 weeks for check of blood pressure. Can reevaluate at this visit.   - Varenicline Tartrate, Starter, (CHANTIX STARTING MONTH PAK) 0.5 MG X 11 & 1 MG X 42 TBPK; Days 1 to 3: 0.5 mg once daily. Days 4 to 7: 0.5 mg twice daily. Then 1 mg twice daily  Dispense: 1 each; Refill: 0 - nicotine (NICODERM CQ - DOSED IN MG/24 HOURS) 14 mg/24hr patch; Place 1 patch (14 mg total) onto the skin daily.  Dispense: 28 patch; Refill: 1

## 2023-06-21 NOTE — Assessment & Plan Note (Signed)
One time screening in low risk individual.  - HIV antibody (with reflex)

## 2023-06-21 NOTE — Assessment & Plan Note (Signed)
Patient has high cardiovascular risk of elevated blood pressure, smoking, obesity, and family history of stroke.  Screen for HLD with lipid panel.  - CMP14+EGFR - Lipid panel

## 2023-06-21 NOTE — Assessment & Plan Note (Signed)
One time screening in low risk individual - HCV Ab w Reflex to Quant PCR

## 2023-06-21 NOTE — Assessment & Plan Note (Signed)
Patient is african [place] male with elevated blood pressure and family history of stroke He is not currently having symptoms of hyperglycemia Screen for type 2 diabetes mellitus today with hemoglobin A1c - Hemoglobin A1c

## 2023-06-21 NOTE — Patient Instructions (Signed)
Thank you, Mr.Anothony A Reyburn for allowing [place] to provide your care today.   I have ordered the following labs for you:   Lab Orders         CMP14+EGFR         Hemoglobin A1c         Lipid panel         HIV antibody (with reflex)         HCV Ab w Reflex to Quant PCR       Reminders: Go by lab. Follow up in 6 weeks and bring blood pressure readings.     Thurmon Fair, M.D.

## 2023-06-21 NOTE — Progress Notes (Signed)
HPI:Craig Bowers is a 37 y.o. male with no PMHx or PSHx who presents to establish care. He followed previously with Alvina Filbert in [place]. For the details of today's visit, please refer to the assessment and plan.  No past medical history on file.  No past surgical history on file.  Family History  Problem Relation Age of Onset   Hypertension Father    Stroke Father    Hypertension Sister     Social History   Tobacco Use   Smoking status: Every Day    Current packs/day: 0.50    Average packs/day: 0.5 packs/day for 20.5 years (10.3 ttl pk-yrs)    Types: Cigarettes    Start date: 2004   Smokeless tobacco: Never  Substance Use Topics   Alcohol use: Yes    Comment: occasionally   Drug use: Never     Physical Exam: Vitals:   06/21/23 1103  BP: (!) 146/86  Pulse: (!) 59  Resp: 16  SpO2: 98%  Weight: 226 lb (102.5 kg)  Height: 5\' 11"  (1.803 m)     Physical Exam Constitutional:      Appearance: He is well-developed and well-groomed.  Eyes:     General: No scleral icterus.    Conjunctiva/sclera: Conjunctivae normal.  Cardiovascular:     Rate and Rhythm: Normal rate and regular rhythm.     Heart sounds: No murmur heard.    No friction rub. No gallop.  Pulmonary:     Effort: Pulmonary effort is normal.     Breath sounds: No wheezing, rhonchi or rales.  Musculoskeletal:     Right lower leg: No edema.     Left lower leg: No edema.  Skin:    General: Skin is warm and dry.      Assessment & Plan:   Tobacco use disorder Patient has smoked since 2004 , Has 20.5 pack years. Currently smoking half pack per day.   Asked about quitting: confirms that he currently smokes cigarettes Advise to quit smoking: Educated about QUITTING to reduce the risk of cancer, cardio and cerebrovascular disease. Assess willingness: willing to quit at this time Assist with counseling and pharmacotherapy: Counseled for 5 minutes and prescribed Chantix and Nicotine  patches Arrange for follow up: follow up scheduled in 6 weeks for check of blood pressure. Can reevaluate at this visit.   - Varenicline Tartrate, Starter, (CHANTIX STARTING MONTH PAK) 0.5 MG X 11 & 1 MG X 42 TBPK; Days 1 to 3: 0.5 mg once daily. Days 4 to 7: 0.5 mg twice daily. Then 1 mg twice daily  Dispense: 1 each; Refill: 0 - nicotine (NICODERM CQ - DOSED IN MG/24 HOURS) 14 mg/24hr patch; Place 1 patch (14 mg total) onto the skin daily.  Dispense: 28 patch; Refill: 1  Screening for hyperlipidemia Patient has high cardiovascular risk of elevated blood pressure, smoking, obesity, and family history of stroke.  Screen for HLD with lipid panel.  - CMP14+EGFR - Lipid panel  Screening for HIV (human immunodeficiency virus) One time screening in low risk individual.  - HIV antibody (with reflex)  Need for hepatitis C screening test One time screening in low risk individual - HCV Ab w Reflex to Quant PCR  Screening for diabetes mellitus Patient is african [place] male with elevated blood pressure and family history of stroke He is not currently having symptoms of hyperglycemia Screen for type 2 diabetes mellitus today with hemoglobin A1c - Hemoglobin A1c    Milus Banister,  MD

## 2023-06-21 NOTE — Addendum Note (Signed)
Addended by: Abner Greenspan on: 06/21/2023 01:52 PM   Modules accepted: Orders

## 2023-06-22 LAB — CMP14+EGFR
ALT: 33 IU/L (ref 0–44)
AST: 23 IU/L (ref 0–40)
Albumin: 4.4 g/dL (ref 4.1–5.1)
Alkaline Phosphatase: 71 IU/L (ref 44–121)
BUN/Creatinine Ratio: 13 (ref 9–20)
BUN: 13 mg/dL (ref 6–20)
Bilirubin Total: 0.6 mg/dL (ref 0.0–1.2)
CO2: 24 mmol/L (ref 20–29)
Calcium: 9.9 mg/dL (ref 8.7–10.2)
Chloride: 105 mmol/L (ref 96–106)
Creatinine, Ser: 0.98 mg/dL (ref 0.76–1.27)
Globulin, Total: 2.3 g/dL (ref 1.5–4.5)
Glucose: 89 mg/dL (ref 70–99)
Potassium: 4.5 mmol/L (ref 3.5–5.2)
Sodium: 142 mmol/L (ref 134–144)
Total Protein: 6.7 g/dL (ref 6.0–8.5)
eGFR: 102 mL/min/{1.73_m2} (ref >59)

## 2023-06-22 LAB — HCV AB W REFLEX TO QUANT PCR: HCV Ab: NONREACTIVE

## 2023-06-22 LAB — LIPID PANEL
Chol/HDL Ratio: 3.9 ratio (ref 0.0–5.0)
Cholesterol, Total: 168 mg/dL (ref 100–199)
HDL: 43 mg/dL (ref >39)
LDL Chol Calc (NIH): 102 mg/dL — ABNORMAL HIGH (ref 0–99)
Triglycerides: 126 mg/dL (ref 0–149)
VLDL Cholesterol Cal: 23 mg/dL (ref 5–40)

## 2023-06-22 LAB — HEMOGLOBIN A1C
Est. average glucose Bld gHb Est-mCnc: 108 mg/dL
Hgb A1c MFr Bld: 5.4 % (ref 4.8–5.6)

## 2023-06-22 LAB — HIV ANTIBODY (ROUTINE TESTING W REFLEX): HIV Screen 4th Generation wRfx: NONREACTIVE

## 2023-06-22 LAB — HCV INTERPRETATION

## 2023-07-29 ENCOUNTER — Encounter: Payer: Self-pay | Admitting: Internal Medicine

## 2023-07-29 ENCOUNTER — Ambulatory Visit (INDEPENDENT_AMBULATORY_CARE_PROVIDER_SITE_OTHER): Payer: BC Managed Care – PPO | Admitting: Internal Medicine

## 2023-07-29 VITALS — BP 142/78 | HR 71 | Ht 71.0 in | Wt 230.0 lb

## 2023-07-29 DIAGNOSIS — F17200 Nicotine dependence, unspecified, uncomplicated: Secondary | ICD-10-CM

## 2023-07-29 DIAGNOSIS — E785 Hyperlipidemia, unspecified: Secondary | ICD-10-CM | POA: Insufficient documentation

## 2023-07-29 DIAGNOSIS — I10 Essential (primary) hypertension: Secondary | ICD-10-CM | POA: Diagnosis not present

## 2023-07-29 DIAGNOSIS — E7841 Elevated Lipoprotein(a): Secondary | ICD-10-CM

## 2023-07-29 MED ORDER — AMLODIPINE BESYLATE 5 MG PO TABS: 5.0000 mg | ORAL_TABLET | Freq: Every day | ORAL | 1 refills | Status: AC

## 2023-07-29 NOTE — Assessment & Plan Note (Signed)
BP is elevated again today, meeting criteria for essential hypertension.  Through shared decision making, amlodipine 5 mg daily has been started.  He will return to care in 4 weeks for BP check (nurse visit).  Follow-up with me in 6 months.

## 2023-07-29 NOTE — Assessment & Plan Note (Signed)
Lipid panel updated last month.  LDL 102.  Lifestyle modifications aimed at improving his cholesterol were reviewed.  Mediterranean diet recommended.

## 2023-07-29 NOTE — Assessment & Plan Note (Signed)
Currently smokes less than 0.5 packs/day.  He has been smoking since 2004 and expresses a desire to quit.  Chantix and NicoDerm patches were prescribed at his last appointment, but he has not filled either prescription. -The patient was counseled on the dangers of tobacco use, and was advised to quit.  Reviewed strategies to maximize success, including removing cigarettes and smoking materials from environment, stress management, substitution of other forms of reinforcement, support of family/friends, and written materials. -For now, Craig Bowers would like to work on gradually weaning himself off of cigarettes.  I offered to refill prescriptions for Chantix and NicoDerm patches but he declined.  We also discussed Nicorette gum.  He will consider this.

## 2023-07-29 NOTE — Progress Notes (Signed)
Established Patient Office Visit  Subjective   Patient ID: Craig Bowers, male    DOB: 08-30-1986  Age: 37 y.o. MRN: [id number]  Chief Complaint  Patient presents with   Hypertension    6 week follow    Craig Bowers returns today for follow-up.  He was last evaluated on 7/16 by Dr. Barbaraann Faster as a new patient presenting to establish care.  At that time Chantix and NicoDerm patches were prescribed to assist with smoking cessation.  Baseline labs were ordered and 6-week follow-up was arranged.  There have been no acute interval events.  Craig Bowers reports feeling well today.  He has not started Chantix or NicoDerm patches.  He does not have any acute concerns to discuss.  History reviewed. No pertinent past medical history. History reviewed. No pertinent surgical history. Social History   Tobacco Use   Smoking status: Every Day    Current packs/day: 0.50    Average packs/day: 0.5 packs/day for 20.6 years (10.3 ttl pk-yrs)    Types: Cigarettes    Start date: 2004   Smokeless tobacco: Never  Substance Use Topics   Alcohol use: Yes    Comment: occasionally   Drug use: Never   Family History  Problem Relation Age of Onset   Hypertension Father    Stroke Father    Hypertension Sister    No Known Allergies  Review of Systems  Constitutional:  Negative for chills and fever.  HENT:  Negative for sore throat.   Respiratory:  Negative for cough and shortness of breath.   Cardiovascular:  Negative for chest pain, palpitations and leg swelling.  Gastrointestinal:  Negative for abdominal pain, blood in stool, constipation, diarrhea, nausea and vomiting.  Genitourinary:  Negative for dysuria and hematuria.  Musculoskeletal:  Negative for myalgias.  Skin:  Negative for itching and rash.  Neurological:  Negative for dizziness and headaches.  Psychiatric/Behavioral:  Negative for depression and suicidal ideas.      Objective:     BP (!) 142/78   Pulse 71   Ht 5\' 11"  (1.803 m)   Wt 230  lb (104.3 kg)   SpO2 98%   BMI 32.08 kg/m  BP Readings from Last 3 Encounters:  07/29/23 (!) 142/78  06/21/23 (!) 146/86  04/03/21 (!) 140/101   Physical Exam Vitals reviewed.  Constitutional:      General: He is not in acute distress.    Appearance: Normal appearance. He is obese. He is not ill-appearing.  HENT:     Head: Normocephalic and atraumatic.     Right Ear: External ear normal.     Left Ear: External ear normal.     Nose: Nose normal. No congestion or rhinorrhea.     Mouth/Throat:     Mouth: Mucous membranes are moist.     Pharynx: Oropharynx is clear.  Eyes:     General: No scleral icterus.    Extraocular Movements: Extraocular movements intact.     Conjunctiva/sclera: Conjunctivae normal.     Pupils: Pupils are equal, round, and reactive to light.  Cardiovascular:     Rate and Rhythm: Normal rate and regular rhythm.     Pulses: Normal pulses.     Heart sounds: Normal heart sounds. No murmur heard. Pulmonary:     Effort: Pulmonary effort is normal.     Breath sounds: Normal breath sounds. No wheezing, rhonchi or rales.  Abdominal:     General: Abdomen is flat. Bowel sounds are normal. There is no  distension.     Palpations: Abdomen is soft.     Tenderness: There is no abdominal tenderness.  Musculoskeletal:        General: No swelling or deformity. Normal range of motion.     Cervical back: Normal range of motion.  Skin:    General: Skin is warm and dry.     Capillary Refill: Capillary refill takes less than 2 seconds.  Neurological:     General: No focal deficit present.     Mental Status: He is alert and oriented to person, place, and time.     Motor: No weakness.  Psychiatric:        Mood and Affect: Mood normal.        Behavior: Behavior normal.        Thought Content: Thought content normal.   Last metabolic panel Lab Results  Component Value Date   GLUCOSE 89 06/21/2023   NA 142 06/21/2023   K 4.5 06/21/2023   CL 105 06/21/2023   CO2 24  06/21/2023   BUN 13 06/21/2023   CREATININE 0.98 06/21/2023   EGFR 102 06/21/2023   CALCIUM 9.9 06/21/2023   PROT 6.7 06/21/2023   ALBUMIN 4.4 06/21/2023   LABGLOB 2.3 06/21/2023   BILITOT 0.6 06/21/2023   ALKPHOS 71 06/21/2023   AST 23 06/21/2023   ALT 33 06/21/2023   Last lipids Lab Results  Component Value Date   CHOL 168 06/21/2023   HDL 43 06/21/2023   LDLCALC 102 (H) 06/21/2023   TRIG 126 06/21/2023   CHOLHDL 3.9 06/21/2023   Last hemoglobin A1c Lab Results  Component Value Date   HGBA1C 5.4 06/21/2023     Assessment & Plan:   Problem List Items Addressed This Visit       Essential hypertension - Primary    BP is elevated again today, meeting criteria for essential hypertension.  Through shared decision making, amlodipine 5 mg daily has been started.  He will return to care in 4 weeks for BP check (nurse visit).  Follow-up with me in 6 months.      Tobacco use disorder    Currently smokes less than 0.5 packs/day.  He has been smoking since 2004 and expresses a desire to quit.  Chantix and NicoDerm patches were prescribed at his last appointment, but he has not filled either prescription. -The patient was counseled on the dangers of tobacco use, and was advised to quit.  Reviewed strategies to maximize success, including removing cigarettes and smoking materials from environment, stress management, substitution of other forms of reinforcement, support of family/friends, and written materials. -For now, Craig Bowers would like to work on gradually weaning himself off of cigarettes.  I offered to refill prescriptions for Chantix and NicoDerm patches but he declined.  We also discussed Nicorette gum.  He will consider this.      Hyperlipidemia    Lipid panel updated last month.  LDL 102.  Lifestyle modifications aimed at improving his cholesterol were reviewed.  Mediterranean diet recommended.       Return in about 6 months (around 01/29/2024).    Billie Lade,  MD

## 2023-07-29 NOTE — Patient Instructions (Signed)
It was a pleasure to see you today.  Thank you for giving [place] the opportunity to be involved in your care.  Below is a brief recap of your visit and next steps.  We will plan to see you again in 6 months.  Summary Start amlodipine 5 mg daily for high blood pressure Consider the Mediterranean diet to help with your cholesterol Continue to work on stopping smoking Nurse visit in 4 weeks for BP check Follow up with me in 6 months

## 2024-01-30 ENCOUNTER — Ambulatory Visit: Payer: BC Managed Care – PPO | Admitting: Internal Medicine
